# Patient Record
Sex: Male | Born: 1990 | Race: White | Hispanic: No | Marital: Married | Smoking: Never smoker
Health system: Southern US, Community
[De-identification: ages and names within clinical notes are randomized; demographics above are authoritative.]

## PROBLEM LIST (undated history)

## (undated) DIAGNOSIS — J45909 Unspecified asthma, uncomplicated: Secondary | ICD-10-CM

## (undated) DIAGNOSIS — T7840XA Allergy, unspecified, initial encounter: Secondary | ICD-10-CM

## (undated) DIAGNOSIS — J069 Acute upper respiratory infection, unspecified: Secondary | ICD-10-CM

## (undated) HISTORY — DX: Allergy, unspecified, initial encounter: T78.40XA

## (undated) HISTORY — PX: FRACTURE SURGERY: SHX138

## (undated) HISTORY — PX: WISDOM TOOTH EXTRACTION: SHX21

## (undated) HISTORY — DX: Acute upper respiratory infection, unspecified: J06.9

## (undated) HISTORY — DX: Unspecified asthma, uncomplicated: J45.909

---

## 2014-07-25 ENCOUNTER — Ambulatory Visit (INDEPENDENT_AMBULATORY_CARE_PROVIDER_SITE_OTHER): Payer: Managed Care, Other (non HMO) | Admitting: Physician Assistant

## 2014-07-25 VITALS — BP 122/82 | HR 98 | Temp 99.1°F | Resp 18 | Ht 69.0 in | Wt 191.0 lb

## 2014-07-25 DIAGNOSIS — R062 Wheezing: Secondary | ICD-10-CM | POA: Diagnosis not present

## 2014-07-25 DIAGNOSIS — J309 Allergic rhinitis, unspecified: Secondary | ICD-10-CM

## 2014-07-25 DIAGNOSIS — R059 Cough, unspecified: Secondary | ICD-10-CM

## 2014-07-25 DIAGNOSIS — R05 Cough: Secondary | ICD-10-CM

## 2014-07-25 DIAGNOSIS — J3089 Other allergic rhinitis: Secondary | ICD-10-CM | POA: Insufficient documentation

## 2014-07-25 MED ORDER — HYDROCODONE-HOMATROPINE 5-1.5 MG/5ML PO SYRP: 5.0000 mL | ORAL_SOLUTION | Freq: Three times a day (TID) | ORAL | Status: DC | PRN

## 2014-07-25 MED ORDER — ALBUTEROL SULFATE (2.5 MG/3ML) 0.083% IN NEBU
2.5000 mg | INHALATION_SOLUTION | Freq: Once | RESPIRATORY_TRACT | Status: AC
  Administered 2014-07-25: 2.5 mg via RESPIRATORY_TRACT

## 2014-07-25 MED ORDER — IPRATROPIUM BROMIDE 0.02 % IN SOLN
0.5000 mg | Freq: Once | RESPIRATORY_TRACT | Status: AC
  Administered 2014-07-25: 0.5 mg via RESPIRATORY_TRACT

## 2014-07-25 MED ORDER — BENZONATATE 100 MG PO CAPS: 100.0000 mg | ORAL_CAPSULE | Freq: Three times a day (TID) | ORAL | Status: DC | PRN

## 2014-07-25 NOTE — Patient Instructions (Signed)
I think your allergic rhinitis is leading to post nasal drip and cough. For the allergies, zyrtec and flonase daily. Continue using the sudafed daily for congestion.  For the cough, tessalon during the day and hycodan at night will help. The hycodan will help you to sleep. Please let us know if you're not improved in one week.

## 2014-07-25 NOTE — Progress Notes (Signed)
   Subjective:    Patient ID: Douglas Cervantes, male    DOB: May 24, 1990, 24 y.o.   MRN: [id number]  Chief Complaint  Patient presents with  . Cough    X 1 week  . Sore Throat    X 1 week   Patient Active Problem List   Diagnosis Date Noted  . Allergic rhinitis 07/25/2014   Prior to Admission medications   Medication Sig Start Date End Date Taking? Authorizing Provider  acetaminophen (TYLENOL) 500 MG tablet Take 500 mg by mouth every 6 (six) hours as needed.    Historical Provider, MD   Medications, allergies, past medical history, surgical history, family history, social history and problem list reviewed and updated.  HPI  24 yom with pmh mild allergic rhinitis presents with cough, st.  Sx started one wk ago, woke up felt bad. Awoke with sinus pressure, congestion, st, achy. Sx have been persistent for week. Denies fevers, chills. Denies cp, sob, abd pain, n/v, diarrhea.   Had been working in yard prior to allergy sx starting. No ticks. Has been taking sudafed q12 hrs with some relief.   He doesn't take anything for his allergies.   Review of Systems See HPI.     Objective:   Physical Exam  Constitutional: He is oriented to person, place, and time. He appears well-developed and well-nourished.  Non-toxic appearance. He does not have a sickly appearance. He does not appear ill. No distress.  BP 122/82 mmHg  Pulse 98  Temp(Src) 99.1 F (37.3 C) (Oral)  Resp 18  Ht 5\' 9"  (1.753 m)  Wt 191 lb (86.637 kg)  BMI 28.19 kg/m2  SpO2 98%   HENT:  Right Ear: Tympanic membrane normal.  Left Ear: Tympanic membrane normal.  Nose: No mucosal edema or rhinorrhea. Right sinus exhibits no maxillary sinus tenderness and no frontal sinus tenderness. Left sinus exhibits no maxillary sinus tenderness and no frontal sinus tenderness.  Mouth/Throat: Uvula is midline, oropharynx is clear and moist and mucous membranes are normal.  Pulmonary/Chest: Effort normal. He has no decreased breath sounds.  He has wheezes in the right middle field, the right lower field and the left middle field. He has no rhonchi. He has no rales.  Lymphadenopathy:       Head (right side): No submental, no submandibular and no tonsillar adenopathy present.       Head (left side): No submental, no submandibular and no tonsillar adenopathy present.  Neurological: He is alert and oriented to person, place, and time.  Psychiatric: He has a normal mood and affect. His speech is normal.   Duoneb done. Lung sounds post duoneb: Improved. No wheezing, moving air well.      Assessment & Plan:   [age] yom with pmh mild allergic rhinitis presents with cough, st.  Allergic rhinitis, unspecified allergic rhinitis type Cough - Plan: benzonatate (TESSALON) 100 MG capsule, HYDROcodone-homatropine (HYCODAN) 5-1.5 MG/5ML syrup Wheezing - Plan: albuterol (PROVENTIL) (2.5 MG/3ML) 0.083% nebulizer solution 2.5 mg, ipratropium (ATROVENT) nebulizer solution 0.5 mg --no signs infx with one week sickness, normal vitals, benign exam other than wheezing --wheezing cleared with duoneb --likely allergies leading to post nasal drip/cough --zyrtec, flonase, tessalon, hycodan, continue sudafed --pt declines home albuterol  Donnajean Lopesodd M. Koby Pickup, PA-C Physician Assistant-Certified [facility]  07/25/2014 5:35 PM

## 2014-07-31 ENCOUNTER — Ambulatory Visit (INDEPENDENT_AMBULATORY_CARE_PROVIDER_SITE_OTHER): Payer: Managed Care, Other (non HMO) | Admitting: Family Medicine

## 2014-07-31 VITALS — BP 108/88 | HR 96 | Temp 98.8°F | Resp 18 | Ht 69.0 in | Wt 191.6 lb

## 2014-07-31 DIAGNOSIS — R05 Cough: Secondary | ICD-10-CM

## 2014-07-31 DIAGNOSIS — R053 Chronic cough: Secondary | ICD-10-CM

## 2014-07-31 DIAGNOSIS — J209 Acute bronchitis, unspecified: Secondary | ICD-10-CM | POA: Diagnosis not present

## 2014-07-31 MED ORDER — AZITHROMYCIN 250 MG PO TABS: ORAL_TABLET | ORAL | Status: DC

## 2014-07-31 MED ORDER — HYDROCOD POLST-CPM POLST ER 10-8 MG/5ML PO SUER: 5.0000 mL | Freq: Two times a day (BID) | ORAL | Status: DC | PRN

## 2014-07-31 NOTE — Patient Instructions (Signed)
Use the azithromycin antibiotic for likely bronchitis You can use the tussionex syrup as needed for cough- use this OR your other cough syrup, not both at he same time Remember this will make you sleepy and does last about 12 hours Try some of the Cepachol sore throat lozenges for your cough  Let me know if you are not better soon!

## 2014-07-31 NOTE — Progress Notes (Signed)
[facility] [address], [place] [address] [phone]  Date:  07/31/2014   Name:  Douglas Cervantes   DOB:  1990-11-06   MRN:  [id number]  PCP:  No primary care provider on file.    Chief Complaint: Follow-up   History of Present Illness:  Douglas Cervantes is a 24 y.o. very pleasant male patient who presents with the following:  Here today for a recheck of a cough- he was here just about a week ago with one week of sx- sinus pressure, congestion, ST, aches. Treated with hycodan, neb treatment here in office.  He is here today as he still has a cough- the cough medication does help a bit but he is not improving.   The hycodan works for a couple of hours and then wears off He is not sleeping well The ocugh is generally dry- he does not notice sinus sx.   He has not noted a fever- he is geeneraly in good health  Patient Active Problem List   Diagnosis Date Noted  . Allergic rhinitis 07/25/2014    Past Medical History  Diagnosis Date  . Allergy   . Asthma     Past Surgical History  Procedure Laterality Date  . Fracture surgery    . Wisdom tooth extraction      History  Substance Use Topics  . Smoking status: Never Smoker   . Smokeless tobacco: Not on file  . Alcohol Use: 0.0 oz/week    0 Standard drinks or equivalent per week    History reviewed. No pertinent family history.  No Known Allergies  Medication list has been reviewed and updated.  Current Outpatient Prescriptions on File Prior to Visit  Medication Sig Dispense Refill  . acetaminophen (TYLENOL) 500 MG tablet Take 500 mg by mouth every 6 (six) hours as needed.    . benzonatate (TESSALON) 100 MG capsule Take 1-2 capsules (100-200 mg total) by mouth 3 (three) times daily as needed for cough. 40 capsule 0  . HYDROcodone-homatropine (HYCODAN) 5-1.5 MG/5ML syrup Take 5 mLs by mouth every 8 (eight) hours as needed for cough. 120 mL 0   No current facility-administered medications on file prior  to visit.    Review of Systems:  As per HPI- otherwise negative.   Physical Examination: Filed Vitals:   07/31/14 1223  BP: 108/88  Pulse: 108  Temp: 98.8 F (37.1 C)  Resp: 18   Filed Vitals:   07/31/14 1223  Height: 5\' 9"  (1.753 m)  Weight: 191 lb 9.6 oz (86.909 kg)   Body mass index is 28.28 kg/(m^2). Ideal Body Weight: Weight in (lb) to have BMI = 25: 168.9  GEN: WDWN, NAD, Non-toxic, A & O x 3, looks well HEENT: Atraumatic, Normocephalic. Neck supple. No masses, No LAD.  Bilateral TM wnl, oropharynx normal.  PEERL,EOMI.   Ears and Nose: No external deformity. CV: RRR, No M/G/R. No JVD. No thrill. No extra heart sounds. PULM: CTA B, no wheezes, crackles, rhonchi. No retractions. No resp. distress. No accessory muscle use. EXTR: No c/c/e NEURO Normal gait.  PSYCH: Normally interactive. Conversant. Not depressed or anxious appearing.  Calm demeanor.    Assessment and Plan: Acute bronchitis, unspecified organism - Plan: azithromycin (ZITHROMAX) 250 MG tablet  Persistent cough - Plan: chlorpheniramine-HYDROcodone (TUSSIONEX PENNKINETIC ER) 10-8 MG/5ML SUER  Treat for cough for over 2 weeks with azithromycin, tussionex as hycodan is not controlling his cough Follow-up if not better soon- Sooner if worse.  Signed Lamar Blinks, MD

## 2014-08-06 ENCOUNTER — Ambulatory Visit (INDEPENDENT_AMBULATORY_CARE_PROVIDER_SITE_OTHER): Payer: Managed Care, Other (non HMO)

## 2014-08-06 ENCOUNTER — Ambulatory Visit (INDEPENDENT_AMBULATORY_CARE_PROVIDER_SITE_OTHER): Payer: Managed Care, Other (non HMO) | Admitting: Family Medicine

## 2014-08-06 VITALS — BP 130/80 | HR 93 | Temp 98.8°F | Resp 16 | Ht 70.0 in | Wt 194.2 lb

## 2014-08-06 DIAGNOSIS — R053 Chronic cough: Secondary | ICD-10-CM

## 2014-08-06 DIAGNOSIS — R05 Cough: Secondary | ICD-10-CM | POA: Diagnosis not present

## 2014-08-06 MED ORDER — ALBUTEROL SULFATE HFA 108 (90 BASE) MCG/ACT IN AERS: 2.0000 | INHALATION_SPRAY | Freq: Four times a day (QID) | RESPIRATORY_TRACT | Status: DC | PRN

## 2014-08-06 MED ORDER — PREDNISONE 20 MG PO TABS: ORAL_TABLET | ORAL | Status: DC

## 2014-08-06 NOTE — Progress Notes (Signed)
[facility] [address], [place] [address] [phone]  Date:  08/06/2014   Name:  Douglas Cervantes   DOB:  Aug 18, 1990   MRN:  [id number]  PCP:  No primary care provider on file.    Chief Complaint: Cough   History of Present Illness:  Douglas Cervantes is a 24 y.o. very pleasant male patient who presents with the following:  He was here a week ago with 2 weeks of cough and sinus congestion.  Treated with azithromycin and tussionex.   Here today for a recheck We have not yet done a CXR.   He states that he took the zpack and seemed to get better for a couple of days, but then his sx came back He continues to cough- it is dry.  His main concern is that the cough is disturbing his sleep and studies He does not have a fever He has not noted any wheezing States that his mom wanted him to ask if he should see a specialist about this.  Offered to set this up but advised that his sx will probably be gone but the time we get this appt made   Patient Active Problem List   Diagnosis Date Noted  . Allergic rhinitis 07/25/2014    Past Medical History  Diagnosis Date  . Allergy   . Asthma     Past Surgical History  Procedure Laterality Date  . Fracture surgery    . Wisdom tooth extraction      History  Substance Use Topics  . Smoking status: Never Smoker   . Smokeless tobacco: Not on file  . Alcohol Use: 0.0 oz/week    0 Standard drinks or equivalent per week    No family history on file.  No Known Allergies  Medication list has been reviewed and updated.  Current Outpatient Prescriptions on File Prior to Visit  Medication Sig Dispense Refill  . chlorpheniramine-HYDROcodone (TUSSIONEX PENNKINETIC ER) 10-8 MG/5ML SUER Take 5 mLs by mouth every 12 (twelve) hours as needed for cough. 90 mL 0  . HYDROcodone-homatropine (HYCODAN) 5-1.5 MG/5ML syrup Take 5 mLs by mouth every 8 (eight) hours as needed for cough. 120 mL 0  . acetaminophen (TYLENOL) 500 MG tablet  Take 500 mg by mouth every 6 (six) hours as needed.    . benzonatate (TESSALON) 100 MG capsule Take 1-2 capsules (100-200 mg total) by mouth 3 (three) times daily as needed for cough. (Patient not taking: Reported on 08/06/2014) 40 capsule 0   No current facility-administered medications on file prior to visit.    Review of Systems:  As per HPI- otherwise negative.   Physical Examination: Filed Vitals:   08/06/14 1100  BP: 130/80  Pulse: 93  Temp: 98.8 F (37.1 C)  Resp: 16   Filed Vitals:   08/06/14 1100  Height:  (1.778 m)  Weight: 194 lb 3.2 oz (88.089 kg)   Body mass index is 27.86 kg/(m^2). Ideal Body Weight: Weight in (lb) to have BMI = 25: 173.9  GEN: WDWN, NAD, Non-toxic, A & O x 3, looks well, coughing some in room HEENT: Atraumatic, Normocephalic. Neck supple. No masses, No LAD.  Bilateral TM wnl, oropharynx normal.  PEERL,EOMI.   Ears and Nose: No external deformity. CV: RRR, No M/G/R. No JVD. No thrill. No extra heart sounds. PULM: CTA B, no wheezes, crackles, rhonchi. No retractions. No resp. distress. No accessory muscle use. EXTR: No c/c/e NEURO Normal gait.  PSYCH: Normally  interactive. Conversant. Not depressed or anxious appearing.  Calm demeanor.   [facility] reading (PRIMARY) by  Dr. Patsy Lageropland. CXR: normal   Assessment and Plan: Persistent cough - Plan: DG Chest 2 View, predniSONE (DELTASONE) 20 MG tablet, albuterol (PROVENTIL HFA;VENTOLIN HFA) 108 (90 BASE) MCG/ACT inhaler  He has noted persistent cough for a couple of weeks.  Treated with a z-pack but he still notes a cough.   He reports a history of asthma as a child and used an inhaler then.   Reassured that this does not seem to be anything dangerous, and he will call me if sx not resolved soon Meds ordered this encounter  Medications  . predniSONE (DELTASONE) 20 MG tablet    Sig: Take 2 pills a day for 4 days, then 1 pill a day for 4 days    Dispense:  12 tablet    Refill:  0  . albuterol  (PROVENTIL HFA;VENTOLIN HFA) 108 (90 BASE) MCG/ACT inhaler    Sig: Inhale 2 puffs into the lungs every 6 (six) hours as needed for wheezing or shortness of breath.    Dispense:  1 Inhaler    Refill:  2     Signed Abbe AmsterdamJessica Copland, MD

## 2014-08-06 NOTE — Patient Instructions (Signed)
I am sorry that your cough is being so troublesome.  However I do not think it is anything dangerous and will likely improve over the next week or so Use the prednisone as directed and the albuterol as needed Let us know if you do not feel better soon!

## 2014-09-11 ENCOUNTER — Ambulatory Visit (INDEPENDENT_AMBULATORY_CARE_PROVIDER_SITE_OTHER): Payer: Managed Care, Other (non HMO) | Admitting: Physician Assistant

## 2014-09-11 VITALS — BP 100/68 | HR 86 | Temp 98.8°F | Resp 16 | Ht 70.5 in | Wt 195.2 lb

## 2014-09-11 DIAGNOSIS — R05 Cough: Secondary | ICD-10-CM | POA: Diagnosis not present

## 2014-09-11 DIAGNOSIS — R058 Other specified cough: Secondary | ICD-10-CM

## 2014-09-11 DIAGNOSIS — Z8709 Personal history of other diseases of the respiratory system: Secondary | ICD-10-CM | POA: Diagnosis not present

## 2014-09-11 MED ORDER — PREDNISONE 20 MG PO TABS: ORAL_TABLET | ORAL | Status: DC

## 2014-09-11 MED ORDER — BREATHERITE COLL SPACER ADULT MISC: 1.0000 | Freq: Every day | Status: DC

## 2014-09-11 NOTE — Progress Notes (Signed)
Subjective:    Patient ID: Douglas Cervantes, male    DOB: December 30, 1990, 24 y.o.   MRN: [id number]  HPI Patient present for cough that has been present for over 1 month. Has been seen 3 times in our office and once at a different urgent care. States that he was treated with albuterol inhaler and tessalon initially which he does not think have worked. Had tussionex which did work, but makes him too tired to function. He has tried delsym which was recommended at  he does not believe has worked. With second visit was given azithromycin and hycodan and did have any change in cough, however, sinus pressure had improved. Third visit chest xray was clear and prednisone was given which helped. Currently cough is dry and is constant throughout day. Cough causes mid-back pain. Denies fever, sinus pressure, HA, rhinorrhea, congestion, SOB, CP, N/V. H/o childhood asthma, but not current. H/o allergies that are not affecting him today. Had never smoked. Denies CHF, kidney dz, or recent pulm d/o. Request pulm referral. NKDA.   Review of Systems As noted above.    Objective:   Physical Exam  Constitutional: He is oriented to person, place, and time. He appears well-developed and well-nourished. No distress.  Blood pressure 100/68, pulse 86, temperature 98.8 F (37.1 C), temperature source Oral, resp. rate 16, height 5' 10.5" (1.791 m), weight 195 lb 3.2 oz (88.542 kg), SpO2 98 %.  HENT:  Head: Normocephalic and atraumatic.  Right Ear: Tympanic membrane, external ear and ear canal normal.  Left Ear: Tympanic membrane, external ear and ear canal normal.  Nose: Nose normal. No mucosal edema or rhinorrhea. Right sinus exhibits no maxillary sinus tenderness and no frontal sinus tenderness. Left sinus exhibits no maxillary sinus tenderness and no frontal sinus tenderness.  Mouth/Throat: Uvula is midline, oropharynx is clear and moist and mucous membranes are normal. No oropharyngeal exudate, posterior oropharyngeal edema  or posterior oropharyngeal erythema.  Eyes: Conjunctivae are normal. Pupils are equal, round, and reactive to light. Right eye exhibits no discharge. Left eye exhibits no discharge. No scleral icterus.  Neck: Normal range of motion. Neck supple. No JVD present. No thyromegaly present.  Cardiovascular: Normal rate, regular rhythm and normal heart sounds.  PMI is not displaced.  Exam reveals no gallop and no friction rub.   No murmur heard. Pulmonary/Chest: Effort normal and breath sounds normal. No respiratory distress. He has no decreased breath sounds. He has no wheezes. He has no rhonchi. He has no rales. He exhibits no tenderness.  Abdominal: Soft. Bowel sounds are normal. He exhibits no distension and no mass. There is no tenderness. There is no rebound and no guarding.  Musculoskeletal: He exhibits no edema.  Lymphadenopathy:    He has no cervical adenopathy.  Neurological: He is alert and oriented to person, place, and time.  Skin: Skin is warm and dry. No rash noted. He is not diaphoretic. No erythema. No pallor.      Assessment & Plan:  1. Post-viral cough syndrome 2. Personal history of asthma Can continue to use cepacol. Can use dextromethoraphan otc. If sx improve before pulm appt, can cancel. Explained cough syndrome and that condition must run its course.  - predniSONE (DELTASONE) 20 MG tablet; Take 2 pills a day for 4 days, then 1 pill a day for 4 days  Dispense: 12 tablet; Refill: 0 - Ambulatory referral to Pulmonology - Spacer/Aero-Holding Chambers (BREATHERITE COLL SPACER ADULT) MISC; Take 1 Device by mouth daily.  Dispense: 1  each; Refill: 0     Landon Bassford PA-C  [facility] 09/11/2014 9:50 AM

## 2014-09-11 NOTE — Patient Instructions (Signed)
Cough, Adult  A cough is a reflex that helps clear your throat and airways. It can help heal the body or may be a reaction to an irritated airway. A cough may only last 2 or 3 weeks (acute) or may last more than 8 weeks (chronic).  CAUSES Acute cough:  Viral or bacterial infections. Chronic cough:  Infections.  Allergies.  Asthma.  Post-nasal drip.  Smoking.  Heartburn or acid reflux.  Some medicines.  Chronic lung problems (COPD).  Cancer. SYMPTOMS   Cough.  Fever.  Chest pain.  Increased breathing rate.  High-pitched whistling sound when breathing (wheezing).  Colored mucus that you cough up (sputum). TREATMENT   A bacterial cough may be treated with antibiotic medicine.  A viral cough must run its course and will not respond to antibiotics.  Your caregiver may recommend other treatments if you have a chronic cough. HOME CARE INSTRUCTIONS   Only take over-the-counter or prescription medicines for pain, discomfort, or fever as directed by your caregiver. Use cough suppressants only as directed by your caregiver.  Use a cold steam vaporizer or humidifier in your bedroom or home to help loosen secretions.  Sleep in a semi-upright position if your cough is worse at night.  Rest as needed.  Stop smoking if you smoke. SEEK IMMEDIATE MEDICAL CARE IF:   You have pus in your sputum.  Your cough starts to worsen.  You cannot control your cough with suppressants and are losing sleep.  You begin coughing up blood.  You have difficulty breathing.  You develop pain which is getting worse or is uncontrolled with medicine.  You have a fever. MAKE SURE YOU:   Understand these instructions.  Will watch your condition.  Will get help right away if you are not doing well or get worse. Document Released: 07/09/2010 Document Revised: 04/04/2011 Document Reviewed: 07/09/2010 ExitCare Patient Information 2015 [employer]. This information is not intended  to replace advice given to you by your health care provider. Make sure you discuss any questions you have with your health care provider.  

## 2016-02-08 ENCOUNTER — Ambulatory Visit (INDEPENDENT_AMBULATORY_CARE_PROVIDER_SITE_OTHER): Payer: Self-pay | Admitting: Family Medicine

## 2016-02-08 DIAGNOSIS — R509 Fever, unspecified: Secondary | ICD-10-CM | POA: Insufficient documentation

## 2016-02-08 MED ORDER — AMOXICILLIN-POT CLAVULANATE 875-125 MG PO TABS: 1.0000 | ORAL_TABLET | Freq: Two times a day (BID) | ORAL | 0 refills | Status: DC

## 2016-02-08 NOTE — Patient Instructions (Addendum)
  Thank you for coming in,   I have printed a prescription for an antibiotic that you can use if you fever persists until the end of this week.   Please try using tylenol or ibuprofen to help with your fever.   Please follow up if you symptoms worsen.    Please feel free to call with any questions or concerns at any time, at [phone]. --Dr. Jordan LikesSchmitz    IF you received an x-ray today, you will receive an invoice from [facility]. Please contact [facility] at [phone] with questions or concerns regarding your invoice.   IF you received labwork today, you will receive an invoice from [facility]. Please contact [facility] at [phone] with questions or concerns regarding your invoice.   Our billing staff will not be able to assist you with questions regarding bills from these companies.  You will be contacted with the lab results as soon as they are available. The fastest way to get your results is to activate your My Chart account. Instructions are located on the last page of this paperwork. If you have not heard from us regarding the results in 2 weeks, please contact this office.

## 2016-02-08 NOTE — Progress Notes (Signed)
     Subjective:    Patient ID: Douglas Cervantes, male    DOB: 09-08-1990, 26 y.o.   MRN: [id number]  Chief Complaint  Patient presents with  . Generalized Body Aches    x 1 week   . Nasal Congestion    X 2 weeks  . Cough    PCP: No primary care provider on file.  HPI  This is a 26 y.o. male who is presenting with flu like symptoms. Having some post nasasl drip. Having some lightheadedness. Fever on Friday 101.6. He has been running a fever over the weekend. Has been taking tylenol. He had a fever 100.1 this morning but has taken some tylenol. Everyone at work has been sick with similar symptoms. Didn't receive the flu vaccine. He feels like he is exerting himself more and becoming tired easier. Some prodcution with a cough. Having body aches. Doesn't feel like a sinus infection. No travel and no new medications or recent vaccinations. About two weeks ago he started having sinus issues. Has been taking sudphed for the past two weeks. Really started feeling sick on Friday.   Review of Systems  ROS: No unexpected weight loss, swelling, instability, muscle pain, numbness/tingling, redness, otherwise see HPI    Patient Active Problem List   Diagnosis Date Noted  . Fever 02/08/2016  . Allergic rhinitis 07/25/2014   Surgical: right forearm  Social: no tobacco use, occasional alcohol use  Fhx: none     No Known Allergies    Objective:   Physical Exam BP 122/80 (BP Location: Left Arm, Patient Position: Sitting, Cuff Size: Normal)   Pulse (!) 118   Temp 98.4 F (36.9 C) (Oral)   Resp 16   Ht 5\' 11"  (1.803 m)   Wt 210 lb (95.3 kg)   SpO2 97%   BMI 29.29 kg/m  Gen: NAD, alert, cooperative with exam, well-appearing HEENT: NCAT, EOMI, clear conjunctiva, tonsillar exudates, right TM retracted, left TM normal, no cervical LAD CV: tachycardia, regular rhythm, good S1/S2, no murmur, no edema, capillary refill brisk  Resp: CTABL, no wheezes, non-labored Skin: no rashes, normal turgor    Neuro: no gross deficits.  Psych: alert and oriented      Assessment & Plan:   Fever Most likely for the flu. Possible for a underlying sinus infection as long as his symptoms have been present.  - Advised supportive care  - Provided with Augmentin that he can take towards the end of this week if his symptoms fail to improve and continues to run a fever.  - given indications for return.

## 2016-02-08 NOTE — Assessment & Plan Note (Addendum)
Most likely for the flu. Possible for a underlying sinus infection as long as his symptoms have been present.  - Advised supportive care  - Provided with Augmentin that he can take towards the end of this week if his symptoms fail to improve and continues to run a fever.  - given indications for return.

## 2016-06-20 ENCOUNTER — Emergency Department (HOSPITAL_COMMUNITY): Payer: Self-pay

## 2016-06-20 ENCOUNTER — Emergency Department (HOSPITAL_COMMUNITY)
Admission: EM | Admit: 2016-06-20 | Discharge: 2016-06-21 | Disposition: A | Discharge status: Home / Self Care | Payer: Self-pay | Attending: Emergency Medicine | Admitting: Emergency Medicine

## 2016-06-20 ENCOUNTER — Encounter (HOSPITAL_COMMUNITY): Payer: Self-pay | Admitting: Family Medicine

## 2016-06-20 DIAGNOSIS — S61311A Laceration without foreign body of left index finger with damage to nail, initial encounter: Secondary | ICD-10-CM

## 2016-06-20 DIAGNOSIS — S61211A Laceration without foreign body of left index finger without damage to nail, initial encounter: Secondary | ICD-10-CM | POA: Insufficient documentation

## 2016-06-20 DIAGNOSIS — W260XXA Contact with knife, initial encounter: Secondary | ICD-10-CM | POA: Insufficient documentation

## 2016-06-20 DIAGNOSIS — J45909 Unspecified asthma, uncomplicated: Secondary | ICD-10-CM | POA: Insufficient documentation

## 2016-06-20 DIAGNOSIS — Z23 Encounter for immunization: Secondary | ICD-10-CM | POA: Insufficient documentation

## 2016-06-20 DIAGNOSIS — Y999 Unspecified external cause status: Secondary | ICD-10-CM | POA: Insufficient documentation

## 2016-06-20 DIAGNOSIS — Y929 Unspecified place or not applicable: Secondary | ICD-10-CM | POA: Insufficient documentation

## 2016-06-20 DIAGNOSIS — Z79899 Other long term (current) drug therapy: Secondary | ICD-10-CM | POA: Insufficient documentation

## 2016-06-20 DIAGNOSIS — Y939 Activity, unspecified: Secondary | ICD-10-CM | POA: Insufficient documentation

## 2016-06-20 NOTE — ED Notes (Signed)
Bed: WA05 Expected date:  Expected time:  Means of arrival:  Comments: 

## 2016-06-20 NOTE — ED Triage Notes (Signed)
Patient has a laceration to the left index finger from cutting it with a knife by accident. Bleeding is controlled.

## 2016-06-21 MED ORDER — TETANUS-DIPHTH-ACELL PERTUSSIS 5-2.5-18.5 LF-MCG/0.5 IM SUSP: 0.5000 mL | Freq: Once | INTRAMUSCULAR | Status: DC

## 2016-06-21 NOTE — ED Provider Notes (Signed)
[facility]-EMERGENCY DEPT Provider Note   CSN: [id number] Arrival date & time: 06/20/16  2145  By signing my name below, I, Douglas Cervantes, attest that this documentation has been prepared under the direction and in the presence of non-physician practitioner, Sharen Hecklaudia Gyanna Jarema, PA-C. Electronically Signed: Modena JanskyAlbert Cervantes, Scribe. 06/21/2016. 12:49 AM.  History   Chief Complaint Chief Complaint  Patient presents with  . Extremity Laceration   The history is provided by the patient. No language interpreter was used.   HPI Comments: Douglas Cervantes is a 26 y.o. male who presents to the Emergency Department complaining of left 2nd finger wound that occurred today. He states he accidentally cut his left 2nd finger/nail with a knife. He washed wound with water and alcohol PTA. He is unsure of tetanus status. He is right dominant. Denies any fever, current pain, numbness, or other complaints at this time.  Past Medical History:  Diagnosis Date  . Allergy   . Asthma     Patient Active Problem List   Diagnosis Date Noted  . Fever 02/08/2016  . Allergic rhinitis 07/25/2014    Past Surgical History:  Procedure Laterality Date  . FRACTURE SURGERY    . WISDOM TOOTH EXTRACTION         Home Medications    Prior to Admission medications   Medication Sig Start Date End Date Taking? Authorizing Provider  acetaminophen (TYLENOL) 500 MG tablet Take 500 mg by mouth every 6 (six) hours as needed.    [provider]  amoxicillin-clavulanate (AUGMENTIN) 875-125 MG tablet Take 1 tablet by mouth 2 (two) times daily. For a total of 10 days. 02/08/16   Douglas Cervantes, Douglas E, MD  OVER THE COUNTER MEDICATION daily.    [provider]  pseudoephedrine (SUDAFED) 120 MG 12 hr tablet Take 120 mg by mouth 2 (two) times daily.    [provider]    Family History History reviewed. No pertinent family history.  Social History Social History  Substance Use Topics  . Smoking status: Never  Smoker  . Smokeless tobacco: Never Used  . Alcohol use 0.0 oz/week     Comment: 3-4 times a week.      Allergies   Patient has no known allergies.   Review of Systems Review of Systems  Constitutional: Negative for fever.  Musculoskeletal: Negative for arthralgias.  Skin: Positive for wound.  Neurological: Negative for numbness.     Physical Exam Updated Vital Signs BP (!) 152/116 (BP Location: Right Arm)   Pulse (!) 105   Temp 98.7 F (37.1 C) (Oral)   Resp 20   Ht 6' (1.829 m)   Wt 210 lb (95.3 kg)   SpO2 100%   BMI 28.48 kg/m   Physical Exam  Constitutional: He is oriented to person, place, and time. He appears well-developed and well-nourished. No distress.  HENT:  Head: Normocephalic and atraumatic.  Mouth/Throat: Oropharynx is clear and moist.  Eyes: Pupils are equal, round, and reactive to light.  Neck: Normal range of motion. Neck supple.  Cardiovascular: Normal rate, regular rhythm and normal heart sounds.  Exam reveals no gallop and no friction rub.   No murmur heard. Pulmonary/Chest: Effort normal and breath sounds normal. No respiratory distress. He has no wheezes.  Abdominal: Soft. Bowel sounds are normal. He exhibits no distension. There is no tenderness.  Neurological: He is alert and oriented to person, place, and time. He exhibits normal muscle tone. Coordination normal.  Sensation to median, ulnar and radial nerve intact bilaterally  Good hand grip bilaterally   Skin: Skin is warm and dry. No rash noted. No erythema.  1 cm laceration to the radial aspect of the left index fingertip involving the corner of the nail. No nail avulsion. No involvement of the finger pad. Good capillary refill.   Psychiatric: He has a normal mood and affect. His behavior is normal.  Nursing note and vitals reviewed.    ED Treatments / Results  DIAGNOSTIC STUDIES: Oxygen Saturation is 100% on RA, normal by my interpretation.    COORDINATION OF CARE: 12:53 AM- Pt  advised of plan for treatment and pt agrees.  Labs (all labs ordered are listed, but only abnormal results are displayed) Labs Reviewed - No data to display  EKG  EKG Interpretation None       Radiology Dg Finger Index Left  Result Date: 06/20/2016 CLINICAL DATA:  Left index finger laceration EXAM: LEFT INDEX FINGER 2+V COMPARISON:  None. FINDINGS: There is no evidence of fracture or dislocation. Small laceration at the tip of the left index finger without radiopaque foreign body nor osseous involvement. There is no evidence of arthropathy or other focal bone abnormality. IMPRESSION: Soft tissue laceration along the tip of the left index finger without underlying osseous involvement. No radiopaque foreign body is noted. Electronically Signed   By: Tollie Eth M.D.   On: 06/20/2016 23:56    Procedures Procedures (including critical care time)  Medications Ordered in ED Medications  Tdap (BOOSTRIX) injection 0.5 mL (not administered)     Initial Impression / Assessment and Plan / ED Course  I have reviewed the triage vital signs and the nursing notes.  Pertinent labs & imaging results that were available during my care of the patient were reviewed by me and considered in my medical decision making (see chart for details).    Patient is a 26 y.o. yo male that presents with very small laceration to left index fingertip not requiring suture repair.  Very small corner of nail involved, no avulsion. Tdap booster given. Bottom of the wound visualized with bleeding controled, no foreign bodies seen.  No tendon injury. Full ROM of digit. Digit is NVI. Laceration occurred < 12 hours prior to repair which was well tolerated. X-ray negative. Pt has no co morbidities to effect normal wound healing. Discussed wound care with pt and answered questions.   Final Clinical Impressions(s) / ED Diagnoses   Final diagnoses:  Laceration of left index finger without foreign body with damage to nail,  initial encounter    New Prescriptions New Prescriptions   No medications on file   I personally performed the services described in this documentation, which was scribed in my presence. The recorded information has been reviewed and is accurate.     Liberty Handy, PA-C 06/21/16 0121    Liberty Handy, PA-C 06/21/16 Azzie Roup, April, MD 06/21/16 [phone]

## 2016-06-21 NOTE — Discharge Instructions (Signed)
Your laceration did not require suture repair today.  I suspect your laceration will heal on its own over the course or 7-10 days.   Please avoid getting your laceration wet for the next 48 hours to allow skin to adhere.    After 48 hours the most important part of treatment is wound care. Rinse your laceration with clean water and soap at least twice daily or whenever it is dirty.  You may apply antibiotic ointment and wrap with a bandaid during the day to keep clean.    Monitor for signs of infection: increased swelling, redness, warmth, yellow or bloody discharge.

## 2017-12-30 IMAGING — CR DG FINGER INDEX 2+V*L*
3 series · 3 of 3 positions shown · non-contrast
Comparison: None.

CLINICAL DATA: Left index finger laceration

EXAM:
LEFT INDEX FINGER 2+V

[x finger pa left]
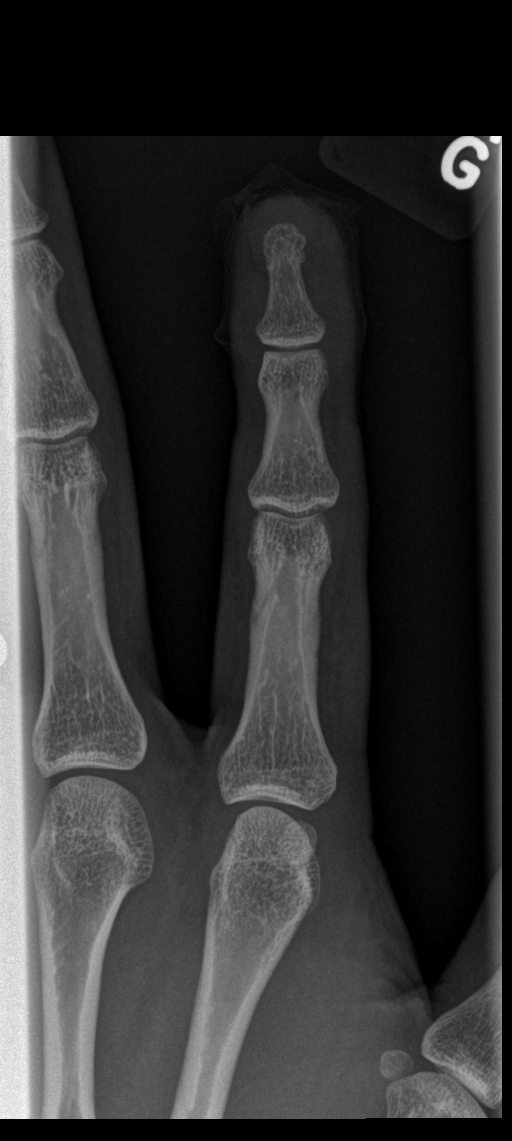

[x finger obl left]
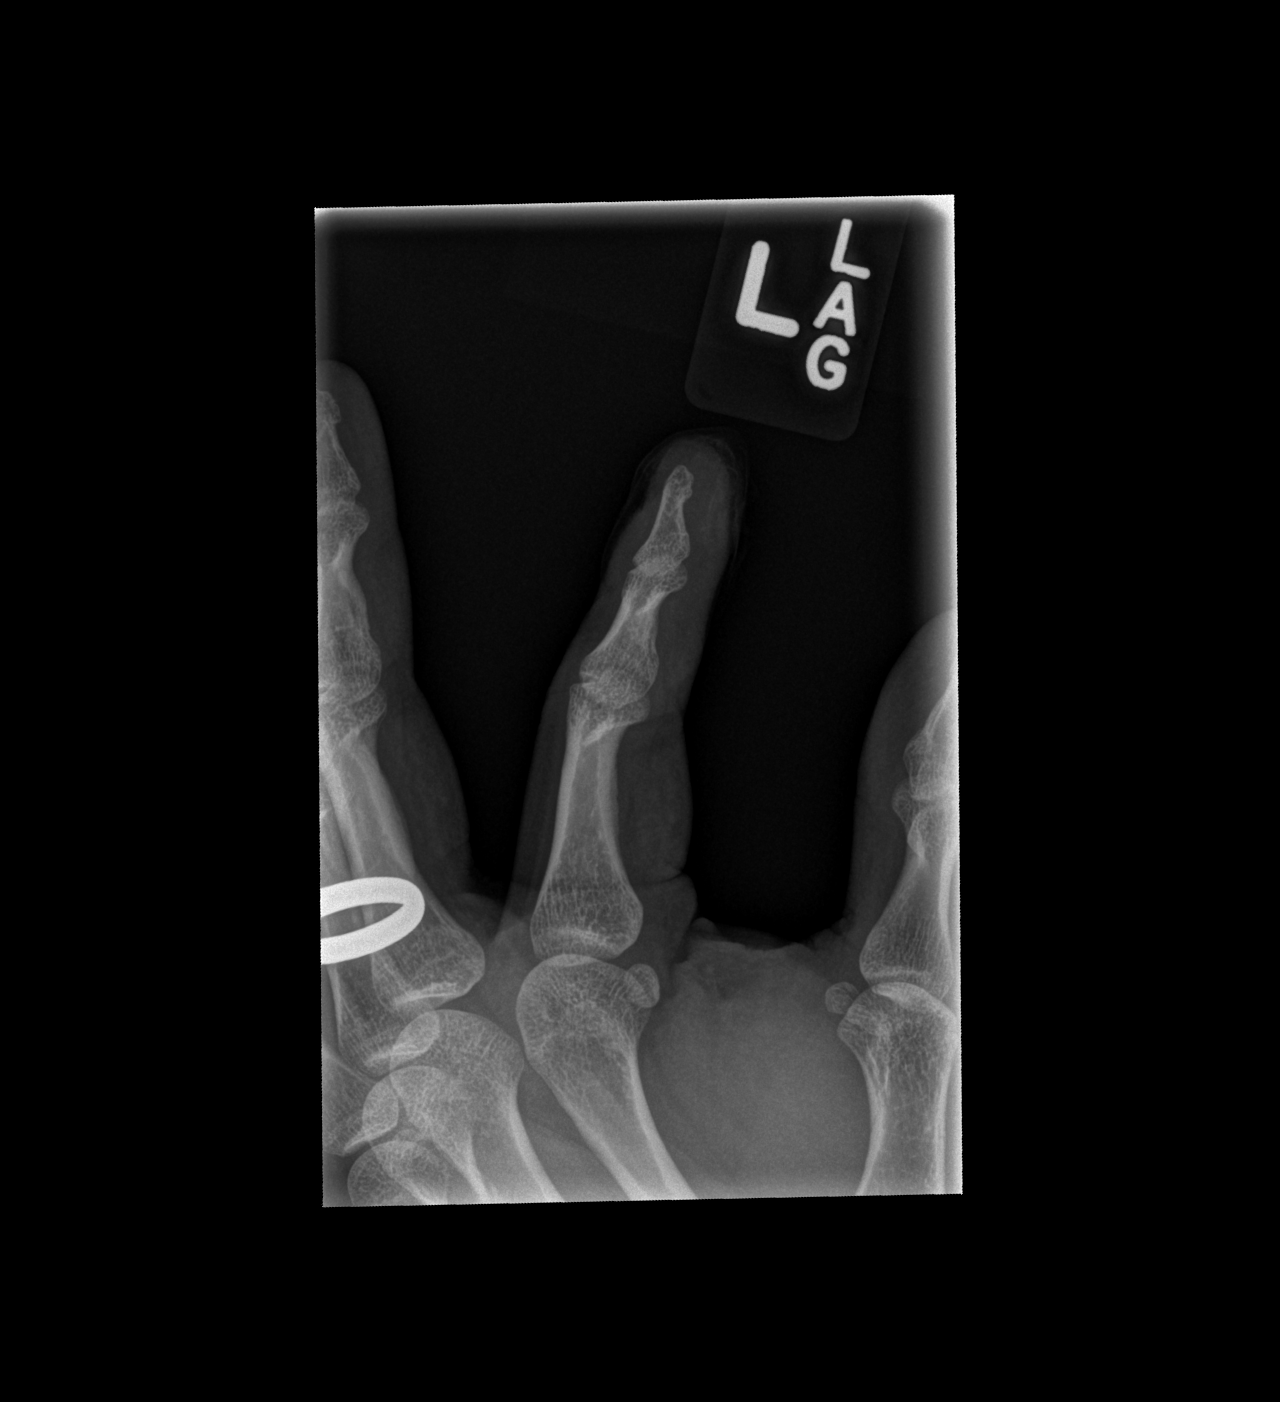

[x finger lat left]
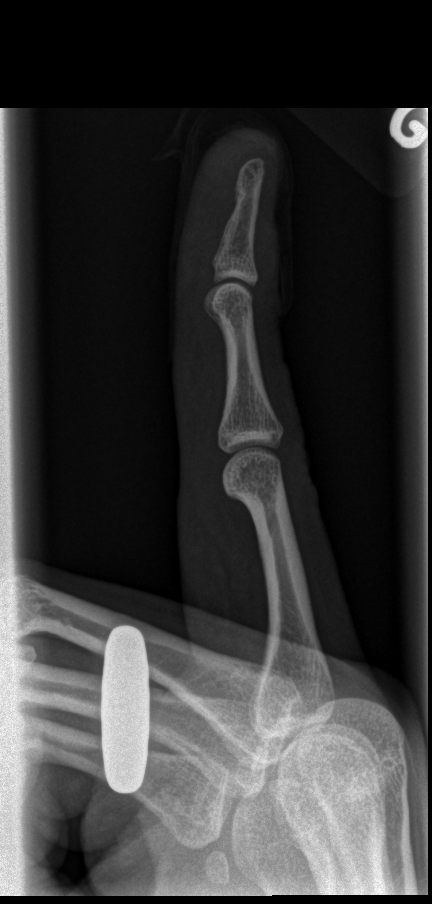

[3 of 3 positions shown; findings below may reference images not displayed]

FINDINGS: There is no evidence of fracture or dislocation. Small laceration at
the tip of the left index finger without radiopaque foreign body nor
osseous involvement. There is no evidence of arthropathy or other
focal bone abnormality.
IMPRESSION: Soft tissue laceration along the tip of the left index finger
without underlying osseous involvement. No radiopaque foreign body
is noted.

## 2018-11-12 ENCOUNTER — Telehealth: Payer: Self-pay

## 2018-11-12 ENCOUNTER — Other Ambulatory Visit: Payer: Self-pay

## 2018-11-12 ENCOUNTER — Encounter: Payer: Self-pay | Admitting: Allergy and Immunology

## 2018-11-12 ENCOUNTER — Ambulatory Visit (INDEPENDENT_AMBULATORY_CARE_PROVIDER_SITE_OTHER): Payer: Managed Care, Other (non HMO) | Admitting: Allergy and Immunology

## 2018-11-12 VITALS — BP 118/70 | HR 102 | Temp 98.0°F | Resp 16 | Ht 70.5 in | Wt 205.0 lb

## 2018-11-12 DIAGNOSIS — J4540 Moderate persistent asthma, uncomplicated: Secondary | ICD-10-CM | POA: Diagnosis not present

## 2018-11-12 DIAGNOSIS — J320 Chronic maxillary sinusitis: Secondary | ICD-10-CM | POA: Diagnosis not present

## 2018-11-12 DIAGNOSIS — J3089 Other allergic rhinitis: Secondary | ICD-10-CM

## 2018-11-12 MED ORDER — BREZTRI AEROSPHERE 160-9-4.8 MCG/ACT IN AERO: 2.0000 | INHALATION_SPRAY | Freq: Two times a day (BID) | RESPIRATORY_TRACT | 5 refills | Status: DC

## 2018-11-12 MED ORDER — CARBINOXAMINE MALEATE 6 MG PO TABS: 1.0000 | ORAL_TABLET | ORAL | 5 refills | Status: DC

## 2018-11-12 MED ORDER — XHANCE 93 MCG/ACT NA EXHU: 2.0000 | INHALANT_SUSPENSION | Freq: Two times a day (BID) | NASAL | 5 refills | Status: DC | PRN

## 2018-11-12 NOTE — Progress Notes (Signed)
New Patient Note  RE: Jarad Barth MRN: [id number] DOB: 1990/09/30 Date of Office Visit: 11/12/2018  Referring provider: Kristen Loader, FNP Primary care provider: Kristen Loader, FNP  Chief Complaint: Sinus Problem and Asthma  History of present illness: Douglas Cervantes is a 28 y.o. male seen today in consultation requested by Jillyn Ledger, [place].  He complains of severe nasal congestion and frequent maxillary sinus pressure.  He reports that the pressure over his cheekbones "feels like cantaloupes."  These symptoms seem to be triggered by rapid weather changes, temperature changes, and season change.  He reports that his symptoms improve when he is indoors and wearing a mask outdoors seems to be helpful.  He is currently taking 120 mg of Sudafed every 8 hours in an attempt to relieve the persistent nasal congestion.  He has never tried a prescription nasal spray and does not like nasal sprays because of the sensation in his nose.  However, he is willing to try a prescription nasal spray if that is necessary. He reports that he has had asthma symptoms since he was 28 years old.  The lower respiratory symptoms improved in his late teens, however seem to have returned with bronchitis in 2016.  He was started on Symbicort 80/4.5 g at that time.  He reports that the cough has been well controlled with Symbicort since that time, however over the past few months he has been experiencing dyspnea, chest tightness, and wheezing despite compliance with Symbicort.  He does not use a spacer device with HFA inhalers.  He was started on montelukast 10 mg daily without perceived benefit.  Assessment and plan: Perennial allergic rhinitis with predominantly nonallergic component  Aeroallergen avoidance measures have been discussed and provided in written form.  A prescription has been provided for [facility], 2 actuations per nostril twice a day. Proper technique has been discussed and demonstrated.  Nasal saline  lavage (NeilMed) has been recommended as needed and prior to medicated nasal sprays along with instructions for proper administration.  A prescription has been provided for RyVent (carbinoxamine maleate) 6mg  every 6-8 hours as needed.  For thick post nasal drainage, nasal congestion, and/or sinus pressure, add guaifenesin 1200 mg (Mucinex Maximum Strength) plus/minus pseudoephedrine 120 mg  twice daily as needed with adequate hydration as discussed. Pseudoephedrine is only to be used for short-term relief of nasal/sinus congestion. Long-term use is discouraged due to potential side effects.  Chronic maxillary sinusitis  Treatment plan as outlined above for mixed rhinitis.  If symptoms persist or progress despite this treatment plan, further evaluation by otolaryngology (Dr. Benjamine Mola) is warranted.  Moderate persistent asthma Currently with suboptimal control.  A sample and prescription have been provided for BrezTri 160 mcg, 2 inhalations twice daily.  To maximize pulmonary deposition, a spacer has been provided along with instructions for its proper administration with an HFA inhaler.  Discontinue Symbicort 80 and montelukast.  Continue albuterol HFA, 1 to 2 inhalations every 4-6 hours if needed.  Subjective and objective measures of pulmonary function will be followed and the treatment plan will be adjusted accordingly.   Meds ordered this encounter  Medications  . Fluticasone Propionate (XHANCE) 93 MCG/ACT EXHU    Sig: Place 2 sprays into both nostrils 2 (two) times daily as needed.    Dispense:  32 mL    Refill:  5  . Carbinoxamine Maleate (RYVENT) 6 MG TABS    Sig: Take 1 tablet by mouth See admin instructions. Every 6-8 hours as needed  Dispense:  120 tablet    Refill:  5  . Budeson-Glycopyrrol-Formoterol (BREZTRI AEROSPHERE) 160-9-4.8 MCG/ACT AERO    Sig: Inhale 2 puffs into the lungs 2 (two) times daily.    Dispense:  10.7 g    Refill:  5    Diagnostics: Spirometry:  Spirometry reveals an FVC of 4.96 L and an FEV1 of 4.23 L (92% predicted) without postbronchodilator improvement.  This study was performed while the patient was asymptomatic.  Please see scanned spirometry results for details. Epicutaneous testing: Negative despite a positive histamine control. Intradermal testing: Positive to molds.   Physical examination: Blood pressure 118/70, pulse (!) 102, temperature 98 F (36.7 C), temperature source Temporal, resp. rate 16, height 5' 10.5" (1.791 m), weight 205 lb (93 kg), SpO2 97 %.  General: Alert, interactive, in no acute distress. HEENT: TMs pearly gray, turbinates moderately edematous with clear discharge, post-pharynx moderately erythematous. Neck: Supple without lymphadenopathy. Lungs: Clear to auscultation without wheezing, rhonchi or rales. CV: Normal S1, S2 without murmurs. Abdomen: Nondistended, nontender. Skin: Warm and dry, without lesions or rashes. Extremities:  No clubbing, cyanosis or edema. Neuro:   Grossly intact.  Review of systems:  Review of systems negative except as noted in HPI / PMHx or noted below: Review of Systems  Constitutional: Negative.   HENT: Negative.   Eyes: Negative.   Respiratory: Negative.   Cardiovascular: Negative.   Gastrointestinal: Negative.   Genitourinary: Negative.   Musculoskeletal: Negative.   Skin: Negative.   Neurological: Negative.   Endo/Heme/Allergies: Negative.   Psychiatric/Behavioral: Negative.     Past medical history:  Past Medical History:  Diagnosis Date  . Allergy   . Asthma   . Recurrent upper respiratory infection (URI)     Past surgical history:  Past Surgical History:  Procedure Laterality Date  . FRACTURE SURGERY    . WISDOM TOOTH EXTRACTION      Family history: Family History  Problem Relation Age of Onset  . Allergic rhinitis Father   . Asthma Paternal Grandmother   . Angioedema Neg Hx   . Atopy Neg Hx   . Immunodeficiency Neg Hx   . Urticaria Neg  Hx     Social history: Social History   Socioeconomic History  . Marital status: Single    Spouse name: Not on file  . Number of children: Not on file  . Years of education: Not on file  . Highest education level: Not on file  Occupational History  . Not on file  Social Needs  . Financial resource strain: Not on file  . Food insecurity    Worry: Not on file    Inability: Not on file  . Transportation needs    Medical: Not on file    Non-medical: Not on file  Tobacco Use  . Smoking status: Never Smoker  . Smokeless tobacco: Never Used  Substance and Sexual Activity  . Alcohol use: Yes    Alcohol/week: 0.0 standard drinks    Comment: 3-4 times a week.   . Drug use: No  . Sexual activity: Not on file  Lifestyle  . Physical activity    Days per week: Not on file    Minutes per session: Not on file  . Stress: Not on file  Relationships  . Social Musician on phone: Not on file    Gets together: Not on file    Attends religious service: Not on file    Active member of club or organization:  Not on file    Attends meetings of clubs or organizations: Not on file    Relationship status: Not on file  . Intimate partner violence    Fear of current or ex partner: Not on file    Emotionally abused: Not on file    Physically abused: Not on file    Forced sexual activity: Not on file  Other Topics Concern  . Not on file  Social History Narrative  . Not on file    Environmental History: The patient lives in a [address] year old townhouse with carpeting the bedroom and central air/heat.  There are 2 cats in the home which have access to the bedroom.  There is no known mold/water damage in the home.  He has a non-smoker.   Current Outpatient Medications  Medication Sig Dispense Refill  . acetaminophen (TYLENOL) 500 MG tablet Take 500 mg by mouth every 6 (six) hours as needed.    Marland Kitchen. albuterol (VENTOLIN HFA) 108 (90 Base) MCG/ACT inhaler Inhale into the lungs.    .  budesonide-formoterol (SYMBICORT) 80-4.5 MCG/ACT inhaler Inhale into the lungs.    Marland Kitchen. guaiFENesin (MUCINEX) 600 MG 12 hr tablet Take 1,200 mg by mouth 2 (two) times daily.    . montelukast (SINGULAIR) 10 MG tablet TK 1 T PO QD    . PARoxetine (PAXIL) 10 MG tablet     . pseudoephedrine (SUDAFED) 120 MG 12 hr tablet Take 120 mg by mouth 2 (two) times daily.    . Budeson-Glycopyrrol-Formoterol (BREZTRI AEROSPHERE) 160-9-4.8 MCG/ACT AERO Inhale 2 puffs into the lungs 2 (two) times daily. 10.7 g 5  . Carbinoxamine Maleate (RYVENT) 6 MG TABS Take 1 tablet by mouth See admin instructions. Every 6-8 hours as needed 120 tablet 5  . Fluticasone Propionate (XHANCE) 93 MCG/ACT EXHU Place 2 sprays into both nostrils 2 (two) times daily as needed. 32 mL 5   No current facility-administered medications for this visit.     Known medication allergies: No Known Allergies  I appreciate the opportunity to take part in Lipa's care. Please do not hesitate to contact me with questions.  Sincerely,   R. Jorene Guestarter Ronak Duquette, MD

## 2018-11-12 NOTE — Assessment & Plan Note (Signed)
Currently with suboptimal control.  A sample and prescription have been provided for BrezTri 160 mcg, 2 inhalations twice daily.  To maximize pulmonary deposition, a spacer has been provided along with instructions for its proper administration with an HFA inhaler.  Discontinue Symbicort 80 and montelukast.  Continue albuterol HFA, 1 to 2 inhalations every 4-6 hours if needed.  Subjective and objective measures of pulmonary function will be followed and the treatment plan will be adjusted accordingly.

## 2018-11-12 NOTE — Assessment & Plan Note (Signed)
   Aeroallergen avoidance measures have been discussed and provided in written form.  A prescription has been provided for [facility], 2 actuations per nostril twice a day. Proper technique has been discussed and demonstrated.  Nasal saline lavage (NeilMed) has been recommended as needed and prior to medicated nasal sprays along with instructions for proper administration.  A prescription has been provided for RyVent (carbinoxamine maleate) 6mg  every 6-8 hours as needed.  For thick post nasal drainage, nasal congestion, and/or sinus pressure, add guaifenesin 1200 mg (Mucinex Maximum Strength) plus/minus pseudoephedrine 120 mg  twice daily as needed with adequate hydration as discussed. Pseudoephedrine is only to be used for short-term relief of nasal/sinus congestion. Long-term use is discouraged due to potential side effects.

## 2018-11-12 NOTE — Patient Instructions (Signed)
Perennial allergic rhinitis with predominantly nonallergic component  Aeroallergen avoidance measures have been discussed and provided in written form.  A prescription has been provided for [facility], 2 actuations per nostril twice a day. Proper technique has been discussed and demonstrated.  Nasal saline lavage (NeilMed) has been recommended as needed and prior to medicated nasal sprays along with instructions for proper administration.  A prescription has been provided for RyVent (carbinoxamine maleate) 6mg  every 6-8 hours as needed.  For thick post nasal drainage, nasal congestion, and/or sinus pressure, add guaifenesin 1200 mg (Mucinex Maximum Strength) plus/minus pseudoephedrine 120 mg  twice daily as needed with adequate hydration as discussed. Pseudoephedrine is only to be used for short-term relief of nasal/sinus congestion. Long-term use is discouraged due to potential side effects.  Chronic maxillary sinusitis  Treatment plan as outlined above for mixed rhinitis.  If symptoms persist or progress despite this treatment plan, further evaluation by otolaryngology (Dr. Benjamine Mola) is warranted.  Moderate persistent asthma Currently with suboptimal control.  A sample and prescription have been provided for BrezTri 160 mcg, 2 inhalations twice daily.  To maximize pulmonary deposition, a spacer has been provided along with instructions for its proper administration with an HFA inhaler.  Discontinue Symbicort 80 and montelukast.  Continue albuterol HFA, 1 to 2 inhalations every 4-6 hours if needed.  Subjective and objective measures of pulmonary function will be followed and the treatment plan will be adjusted accordingly.   Return in about 3 months (around 02/12/2019), or if symptoms worsen or fail to improve.  Control of Mold Allergen  Mold and fungi can grow on a variety of surfaces provided certain temperature and moisture conditions exist.  Outdoor molds grow on plants, decaying  vegetation and soil.  The major outdoor mold, Alternaria and Cladosporium, are found in very high numbers during hot and dry conditions.  Generally, a late Summer - Fall peak is seen for common outdoor fungal spores.  Rain will temporarily lower outdoor mold spore count, but counts rise rapidly when the rainy period ends.  The most important indoor molds are Aspergillus and Penicillium.  Dark, humid and poorly ventilated basements are ideal sites for mold growth.  The next most common sites of mold growth are the bathroom and the kitchen.  Outdoor [employer] 1. Use air conditioning and keep windows closed 2. Avoid exposure to decaying vegetation. 3. Avoid leaf raking. 4. Avoid grain handling. 5. Consider wearing a face mask if working in moldy areas.  Indoor Mold Control 1. Maintain humidity below 50%. 2. Clean washable surfaces with 5% bleach solution. 3. Remove sources e.g. Contaminated carpets.

## 2018-11-12 NOTE — Assessment & Plan Note (Signed)
   Treatment plan as outlined above for mixed rhinitis.  If symptoms persist or progress despite this treatment plan, further evaluation by otolaryngology (Dr. Benjamine Mola) is warranted.

## 2018-11-12 NOTE — Telephone Encounter (Signed)
It will be in the mail tomorrow. Thank you.

## 2018-11-12 NOTE — Telephone Encounter (Signed)
Patient was seen today and needs a break down for his copay payment today.   Thanks

## 2018-11-13 ENCOUNTER — Telehealth: Payer: Self-pay | Admitting: Allergy and Immunology

## 2018-11-13 MED ORDER — BUDESONIDE-FORMOTEROL FUMARATE 160-4.5 MCG/ACT IN AERO: 2.0000 | INHALATION_SPRAY | Freq: Two times a day (BID) | RESPIRATORY_TRACT | 5 refills | Status: DC

## 2018-11-13 MED ORDER — SPIRIVA RESPIMAT 1.25 MCG/ACT IN AERS: 2.0000 | INHALATION_SPRAY | Freq: Every day | RESPIRATORY_TRACT | 5 refills | Status: DC

## 2018-11-13 NOTE — Telephone Encounter (Signed)
Pt called and said that his ins will not cover Breztri. [employer] [phone]

## 2018-11-13 NOTE — Telephone Encounter (Signed)
Prescriptions have been sent in as instructed and patient informed of med change.

## 2018-11-13 NOTE — Telephone Encounter (Signed)
We will need to switch to Symbicort 160-4.5 g, 2 inhalations via spacer device twice daily, and Spiriva Respimat 1.25 g, 2 inhalations daily.  Thanks.

## 2018-11-13 NOTE — Telephone Encounter (Signed)
Dr. Verlin Fester please advise an alternative.

## 2018-11-15 ENCOUNTER — Telehealth: Payer: Self-pay

## 2018-11-15 ENCOUNTER — Telehealth: Payer: Self-pay | Admitting: Allergy and Immunology

## 2018-11-15 NOTE — Telephone Encounter (Signed)
Try Incruse Ellipta, 1 puff daily (along with the Symbicort).  Thanks.

## 2018-11-15 NOTE — Telephone Encounter (Signed)
Spoke to patient about medication and sent Dr. Verlin Fester a message.

## 2018-11-15 NOTE — Telephone Encounter (Signed)
Patient called stated Spiriva was not approved through insurance. Please advise on alternative. Patient currently has the Symbicort.

## 2018-11-15 NOTE — Telephone Encounter (Signed)
Pt called again and said that his ins did not cover breztri and spiriva so need something else sent. Douglas Cervantes

## 2018-11-15 NOTE — Telephone Encounter (Signed)
PA for Ryvent sent through via [email or web address] awaiting for approval.

## 2018-11-16 MED ORDER — INCRUSE ELLIPTA 62.5 MCG/INH IN AEPB: 1.0000 | INHALATION_SPRAY | Freq: Every day | RESPIRATORY_TRACT | 5 refills | Status: DC

## 2018-11-16 NOTE — Telephone Encounter (Signed)
PA submitted for Breztri:  Waiting approval  Your information has been submitted and will be reviewed by [place]. You may close this dialog, return to your dashboard, and perform other tasks. An electronic determination will be received in CoverMyMeds within 72-120 hours. You can see the latest determination by locating this request on your dashboard or by reopening this request. You will receive a fax copy of the determination. If [name] has not responded in 120 hours, contact [employer] at [phone].

## 2018-11-16 NOTE — Addendum Note (Signed)
Addended by: Valere Dross on: 11/16/2018 07:53 AM   Modules accepted: Orders

## 2018-11-16 NOTE — Telephone Encounter (Signed)
Incruse Ellipta sent to [facility] pharmacy in place of Spiriva

## 2018-11-16 NOTE — Telephone Encounter (Signed)
PA was denied for Ryvent. Carbinoxamine 4mg  is preferred. Please advise.

## 2018-11-16 NOTE — Telephone Encounter (Signed)
PA submitted for Spiriva respimat, waiting approval:  Your information has been submitted and will be reviewed by [place]. You may close this dialog, return to your dashboard, and perform other tasks. An electronic determination will be received in CoverMyMeds within 72-120 hours. You can see the latest determination by locating this request on your dashboard or by reopening this request. You will receive a fax copy of the determination. If [name] has not responded in 120 hours, contact [employer] at [phone].

## 2018-11-16 NOTE — Telephone Encounter (Signed)
Called patient and left a detailed voicemail per [facility] permission. Asked patient to call back if he needs any further assistance.

## 2018-11-19 ENCOUNTER — Other Ambulatory Visit: Payer: Self-pay | Admitting: *Deleted

## 2018-11-19 MED ORDER — CARBINOXAMINE MALEATE 4 MG PO TABS: 4.0000 mg | ORAL_TABLET | Freq: Four times a day (QID) | ORAL | 5 refills | Status: DC | PRN

## 2018-11-19 NOTE — Telephone Encounter (Signed)
Prescription has been sent in. Called patient and informed of medication change. Patient verbalized understanding.

## 2018-11-19 NOTE — Telephone Encounter (Addendum)
This request has been denied.  Douglas Cervantes  It looks like symbicort and incruse has been prescribed. Pt has been notified of the medications and some are ready to be picked up at the pharmacy.

## 2018-11-19 NOTE — Telephone Encounter (Signed)
Outcome :  Spiriva Respimat Approved today  PA Case: [id number], Status: Approved, Coverage Starts on: 11/19/2018 12:00:00 AM, Coverage Ends on: 11/19/2019 12:00:00 AM.

## 2018-11-19 NOTE — Telephone Encounter (Signed)
Carbinoxamine 4 mg q 6-8 hours as needed.  Thanks.

## 2019-01-02 ENCOUNTER — Telehealth: Payer: Self-pay | Admitting: Allergy and Immunology

## 2019-01-02 ENCOUNTER — Telehealth: Payer: Self-pay | Admitting: *Deleted

## 2019-01-02 NOTE — Telephone Encounter (Signed)
Patient will be having new insurance starting 01/25/2019 and sent a list of medications that are preferred with his new insurance. The Symbicort and Spiriva 1.25 are covered for next year so that is not an issues, he is going to check to see if the Carbinoxamine is covered since it was not on the list. He did state that the Truett Perna is not covered on his new insurance and it is $100 a month with his current insurance. He was wondering if we could send in a different nasal spray. Please advise.

## 2019-01-02 NOTE — Telephone Encounter (Signed)
Patient called to check on the list of medications that is no longer covered by his insurance. Patient informed that it was received and still needs to be reviewed.   Patient also states that since the weather getting colder, he has had a wheezing cough for the past 3-4 days that he cannot get rid of it. He states that his rescue inhaler is not working. Patient would like recommendations on what he can do. Patient is willing to come in for an office visit if needed.  Please advise.

## 2019-01-02 NOTE — Telephone Encounter (Signed)
Dr Bobbitt, please advise:   

## 2019-01-02 NOTE — Telephone Encounter (Signed)
Which nasal sprays are on his new insurance list?

## 2019-01-03 ENCOUNTER — Other Ambulatory Visit: Payer: Self-pay

## 2019-01-03 MED ORDER — FLUTICASONE PROPIONATE 50 MCG/ACT NA SUSP: 2.0000 | Freq: Every day | NASAL | 5 refills | Status: DC

## 2019-01-03 NOTE — Telephone Encounter (Signed)
Can you take a look at the list for Dr. Verlin Fester. Thanks

## 2019-01-03 NOTE — Telephone Encounter (Signed)
Not many nasal sprays were on the patient's new insurance formulary. Flonase was the only nasal spray that was listed.

## 2019-01-03 NOTE — Telephone Encounter (Signed)
Ok, unfortunately we will have to go with fluticasone (Flonase), 2 sp per nostril daily as needed. Thanks.

## 2019-01-03 NOTE — Telephone Encounter (Signed)
Spoke with patient. Patient made aware of medication change and instructions per Dr. Verlin Fester. Patient verbalized understanding.

## 2019-01-14 ENCOUNTER — Ambulatory Visit (INDEPENDENT_AMBULATORY_CARE_PROVIDER_SITE_OTHER): Payer: 59 | Admitting: Allergy and Immunology

## 2019-01-14 ENCOUNTER — Telehealth: Payer: Self-pay

## 2019-01-14 ENCOUNTER — Encounter: Payer: Self-pay | Admitting: Allergy and Immunology

## 2019-01-14 DIAGNOSIS — J4541 Moderate persistent asthma with (acute) exacerbation: Secondary | ICD-10-CM | POA: Diagnosis not present

## 2019-01-14 DIAGNOSIS — J3089 Other allergic rhinitis: Secondary | ICD-10-CM | POA: Diagnosis not present

## 2019-01-14 DIAGNOSIS — R05 Cough: Secondary | ICD-10-CM

## 2019-01-14 DIAGNOSIS — R053 Chronic cough: Secondary | ICD-10-CM

## 2019-01-14 MED ORDER — HYDROCOD POLST-CPM POLST ER 10-8 MG/5ML PO SUER: 5.0000 mL | Freq: Two times a day (BID) | ORAL | 0 refills | Status: DC | PRN

## 2019-01-14 MED ORDER — SPIRIVA RESPIMAT 1.25 MCG/ACT IN AERS: 2.0000 | INHALATION_SPRAY | Freq: Every day | RESPIRATORY_TRACT | 2 refills | Status: DC

## 2019-01-14 NOTE — Telephone Encounter (Signed)
Pt calling today.  Since the weather has gotten colder, his cough has increased.  Has been using a humidifier to help, but is still wheezy.  Medications were changed to [employer], insurance would not cover so is now using the Incruse and Symbicort.  Using albuterol 1-2 puffs without spacer.  Pt is not getting any better and constantly has to use cough drops to control his cough.  Pt wants to talk to Dr Starling Manns and have a visit.  Visit scheduled for 1115 today, via televisit.

## 2019-01-14 NOTE — Assessment & Plan Note (Signed)
   Prednisone has been provided (as above).  Continue Symbicort 160-4.5 g, 2 inhalations via spacer device twice daily.  A prescription has been provided for Spiriva Respimat 1.25 g, 2 inhalations daily.  Discontinue Incruse Ellipta.  Continue albuterol HFA, 1 to 2 inhalations every 4-6 hours if needed.  The patient has been asked to contact me if his symptoms persist or progress. Otherwise, he may return for follow up in 3 months.

## 2019-01-14 NOTE — Patient Instructions (Addendum)
Cough, persistent The most common causes of chronic cough include the following: upper airway cough syndrome (UACS) which is caused by variety of rhinosinus conditions; asthma; gastroesophageal reflux disease (GERD);  The history suggests that his cough is multifactorial.   A prescription has been provided for prednisone, 40 mg x3 days, 20 mg x1 day, 10 mg x1 day, then stop.  A prescription has been provided for Tussionex ER suspension, 5 mL every 12 hours over the next 3 days, then stop.  Treatment plan as outlined below.   The patient is to inform me if he develops fevers, chills, or discolored mucus production.  Moderate persistent asthma  Prednisone has been provided (as above).  Continue Symbicort 160-4.5 g, 2 inhalations via spacer device twice daily.  A prescription has been provided for Spiriva Respimat 1.25 g, 2 inhalations daily.  Discontinue Incruse Ellipta.  Continue albuterol HFA, 1 to 2 inhalations every 4-6 hours if needed.  The patient has been asked to contact me if his symptoms persist or progress. Otherwise, he may return for follow up in 3 months.  Perennial allergic rhinitis with predominantly nonallergic component  Continue appropriate allergen avoidance measures and fluticasone nasal spray, 2 sprays per nostril daily as needed.  A prescription has been provided for azelastine nasal spray, 1-2 sprays per nostril 2 times daily as needed. Proper nasal spray technique has been discussed and demonstrated.   Nasal saline lavage (NeilMed) has been recommended as needed and prior to medicated nasal sprays along with instructions for proper administration.  For thick post nasal drainage, add guaifenesin 1200 mg (Mucinex Maximum Strength)  twice daily as needed with adequate hydration as discussed.   Return in about 3 months (around 04/14/2019), or if symptoms worsen or fail to improve.

## 2019-01-14 NOTE — Assessment & Plan Note (Signed)
   Continue appropriate allergen avoidance measures and fluticasone nasal spray, 2 sprays per nostril daily as needed.  A prescription has been provided for azelastine nasal spray, 1-2 sprays per nostril 2 times daily as needed. Proper nasal spray technique has been discussed and demonstrated.   Nasal saline lavage (NeilMed) has been recommended as needed and prior to medicated nasal sprays along with instructions for proper administration.  For thick post nasal drainage, add guaifenesin 1200 mg (Mucinex Maximum Strength)  twice daily as needed with adequate hydration as discussed.

## 2019-01-14 NOTE — Progress Notes (Signed)
Follow-up Telemedicine Note  RE: Douglas CoffeeDavid An MRN: [id number] DOB: 1990/03/25 Date of Telemedicine Visit: 01/14/2019  Primary care provider: Soundra PilonBrake, Andrew R, FNP Referring provider: Soundra PilonBrake, Andrew R, FNP  Telemedicine Follow Up Visit via Telephone: I connected with Douglas Coffeeavid Cervantes for a follow up on 01/15/19 by telephone and verified that I am speaking with the correct person using two identifiers.   The limitations, risks, security and privacy concerns of performing an evaluation and management service by telemedicine, the availability of in person appointments, and that there may be a patient responsible charge related to this service were discussed. The patient expressed understanding and agreed to proceed.  Patient is at home. Provider is at the office.  Visit start time: 11:48 am Visit end time: 12:23 pm [facility] and medical assistant/nurse: French Anaracy  History of present illness: Douglas Cervantes is a 28 y.o. male with mixed rhinitis, chronic sinusitis, and moderate persistent asthma, presenting today via telemedicine for a sick visit.  He was last seen in this clinic for his initial evaluation on November 12, 2018.  He reports that over the past 2 weeks he has experienced a persistent cough which has progressed significantly over the past 4 or 5 days.  He believes that the problem may have been precipitated by dry, cold air.  He has found benefit by using a cool mist humidifier.  At times, he feels that the cough originates from a tickle in the base of his throat which then turns into "a wheezing, coughing fit."  Albuterol provides relief on occasion, but at other times does not provide significant relief.  Denies heartburn.  He is not using nasal saline irrigation.  He denies fevers, chills, and discolored mucus production.  He reports that his insurance will be changing at the beginning of the new year.  Incruse will no longer be covered, however Spiriva  Respimat is apparently covered.  He states that when he used TogoXhance he experienced rapid symptom relief stating that his nasal symptoms improved "overnight 100% better" and he no longer needed pseudoephedrine.  However, his insurance does not cover ChicalXhance.  He had used the sample of BrezTri with significant symptom reduction, however his insurance did not cover this medication and therefore he was switched to Symbicort 160/4.5 g, and Incruse Ellipta.  Assessment and plan: Cough, persistent The most common causes of chronic cough include the following: upper airway cough syndrome (UACS) which is caused by variety of rhinosinus conditions; asthma; gastroesophageal reflux disease (GERD);  The history suggests that his cough is multifactorial.   A prescription has been provided for prednisone, 40 mg x3 days, 20 mg x1 day, 10 mg x1 day, then stop.  A prescription has been provided for Tussionex ER suspension, 5 mL every 12 hours over the next 3 days, then stop.  Treatment plan as outlined below.   The patient is to inform me if he develops fevers, chills, or discolored mucus production.  Moderate persistent asthma  Prednisone has been provided (as above).  Continue Symbicort 160-4.5 g, 2 inhalations via spacer device twice daily.  A prescription has been provided for Spiriva Respimat 1.25 g, 2 inhalations daily.  Discontinue Incruse Ellipta.  Continue albuterol HFA, 1 to 2 inhalations every 4-6 hours if needed.  The patient has been asked to contact me if his symptoms persist or progress. Otherwise, he may return for follow up in 3 months.  Perennial allergic rhinitis with predominantly nonallergic component  Continue appropriate  allergen avoidance measures and fluticasone nasal spray, 2 sprays per nostril daily as needed.  A prescription has been provided for azelastine nasal spray, 1-2 sprays per nostril 2 times daily as needed. Proper nasal spray technique has been discussed and  demonstrated.   Nasal saline lavage (NeilMed) has been recommended as needed and prior to medicated nasal sprays along with instructions for proper administration.  For thick post nasal drainage, add guaifenesin 1200 mg (Mucinex Maximum Strength)  twice daily as needed with adequate hydration as discussed.   Meds ordered this encounter  Medications  . chlorpheniramine-HYDROcodone (TUSSIONEX PENNKINETIC ER) 10-8 MG/5ML SUER    Sig: Take 5 mLs by mouth every 12 (twelve) hours as needed for cough.    Dispense:  60 mL    Refill:  0  . Tiotropium Bromide Monohydrate (SPIRIVA RESPIMAT) 1.25 MCG/ACT AERS    Sig: Inhale 2 puffs into the lungs daily.    Dispense:  4 g    Refill:  2  . predniSONE (DELTASONE) tablet 10 mg    Diagnostics: None.  Physical examination: Physical Exam Not obtained as encounter was done via telephone.   The following portions of the patient's history were reviewed and updated as appropriate: allergies, current medications, past family history, past medical history, past social history, past surgical history and problem list.  Allergies as of 01/14/2019   No Known Allergies     Medication List       Accurate as of January 14, 2019 11:59 PM. If you have any questions, ask your nurse or doctor.        STOP taking these medications   Breztri Aerosphere 160-9-4.8 MCG/ACT Aero Generic drug: Budeson-Glycopyrrol-Formoterol Stopped by: Wellington Hampshire, MD     TAKE these medications   acetaminophen 500 MG tablet Commonly known as: TYLENOL Take 500 mg by mouth every 6 (six) hours as needed.   budesonide-formoterol 160-4.5 MCG/ACT inhaler Commonly known as: SYMBICORT Inhale 2 puffs into the lungs 2 (two) times daily.   Carbinoxamine Maleate 4 MG Tabs Take 1 tablet (4 mg total) by mouth every 6 (six) hours as needed.   chlorpheniramine-HYDROcodone 10-8 MG/5ML Suer Commonly known as: Tussionex Pennkinetic ER Take 5 mLs by mouth every 12 (twelve) hours  as needed for cough. Started by: Wellington Hampshire, MD   fluticasone 50 MCG/ACT nasal spray Commonly known as: FLONASE Place 2 sprays into both nostrils daily. As needed. What changed: Another medication with the same name was removed. Continue taking this medication, and follow the directions you see here. Changed by: Wellington Hampshire, MD   guaiFENesin 600 MG 12 hr tablet Commonly known as: MUCINEX Take 1,200 mg by mouth 2 (two) times daily.   Incruse Ellipta 62.5 MCG/INH Aepb Generic drug: umeclidinium bromide Inhale 1 puff into the lungs daily.   PARoxetine 10 MG tablet Commonly known as: PAXIL   pseudoephedrine 120 MG 12 hr tablet Commonly known as: SUDAFED Take 120 mg by mouth 2 (two) times daily.   Spiriva Respimat 1.25 MCG/ACT Aers Generic drug: Tiotropium Bromide Monohydrate Inhale 2 puffs into the lungs daily.       No Known Allergies  Review of systems: Review of systems negative except as noted in HPI / PMHx.  Past Medical History:  Diagnosis Date  . Allergy   . Asthma   . Recurrent upper respiratory infection (URI)     Family History  Problem Relation Age of Onset  . Allergic rhinitis Father   . Asthma Paternal  Grandmother   . Angioedema Neg Hx   . Atopy Neg Hx   . Immunodeficiency Neg Hx   . Urticaria Neg Hx   . Eczema Neg Hx     Social History   Socioeconomic History  . Marital status: Single    Spouse name: Not on file  . Number of children: Not on file  . Years of education: Not on file  . Highest education level: Not on file  Occupational History  . Not on file  Tobacco Use  . Smoking status: Never Smoker  . Smokeless tobacco: Never Used  Substance and Sexual Activity  . Alcohol use: Yes    Alcohol/week: 0.0 standard drinks    Comment: 3-4 times a week.   . Drug use: No  . Sexual activity: Not on file  Other Topics Concern  . Not on file  Social History Narrative  . Not on file   Social Determinants of Health   Financial  Resource Strain:   . Difficulty of Paying Living Expenses: Not on file  Food Insecurity:   . Worried About Charity fundraiser in the Last Year: Not on file  . Ran Out of Food in the Last Year: Not on file  Transportation Needs:   . Lack of Transportation (Medical): Not on file  . Lack of Transportation (Non-Medical): Not on file  Physical Activity:   . Days of Exercise per Week: Not on file  . Minutes of Exercise per Session: Not on file  Stress:   . Feeling of Stress : Not on file  Social Connections:   . Frequency of Communication with Friends and Family: Not on file  . Frequency of Social Gatherings with Friends and Family: Not on file  . Attends Religious Services: Not on file  . Active Member of Clubs or Organizations: Not on file  . Attends Archivist Meetings: Not on file  . Marital Status: Not on file  Intimate Partner Violence:   . Fear of Current or Ex-Partner: Not on file  . Emotionally Abused: Not on file  . Physically Abused: Not on file  . Sexually Abused: Not on file   Previous notes and tests were reviewed.  I discussed the assessment and treatment plan with the patient. The patient was provided an opportunity to ask questions and all were answered. The patient agreed with the plan and demonstrated an understanding of the instructions.   The patient was advised to call back or seek an in-person evaluation if the symptoms worsen or if the condition fails to improve as anticipated.  I provided 30 minutes of non-face-to-face time during this encounter.  I appreciate the opportunity to take part in Marvon's care. Please do not hesitate to contact me with questions.  Sincerely,   R. Edgar Frisk, MD

## 2019-01-14 NOTE — Assessment & Plan Note (Signed)
The most common causes of chronic cough include the following: upper airway cough syndrome (UACS) which is caused by variety of rhinosinus conditions; asthma; gastroesophageal reflux disease (GERD);  The history suggests that his cough is multifactorial.   A prescription has been provided for prednisone, 40 mg x3 days, 20 mg x1 day, 10 mg x1 day, then stop.  A prescription has been provided for Tussionex ER suspension, 5 mL every 12 hours over the next 3 days, then stop.  Treatment plan as outlined below.   The patient is to inform me if he develops fevers, chills, or discolored mucus production.

## 2019-01-14 NOTE — Telephone Encounter (Signed)
Noted and the televisit is complete.

## 2019-01-15 ENCOUNTER — Encounter: Payer: Self-pay | Admitting: Allergy and Immunology

## 2019-01-15 MED ORDER — PREDNISONE 1 MG PO TABS: 10.0000 mg | ORAL_TABLET | Freq: Every day | ORAL | Status: DC

## 2019-01-21 ENCOUNTER — Telehealth: Payer: Self-pay | Admitting: *Deleted

## 2019-01-21 MED ORDER — AZITHROMYCIN 250 MG PO TABS: ORAL_TABLET | ORAL | 0 refills | Status: AC

## 2019-01-21 MED ORDER — PREDNISONE 10 MG PO TABS: ORAL_TABLET | ORAL | 0 refills | Status: AC

## 2019-01-21 NOTE — Telephone Encounter (Addendum)
Patient had televisit with Dr. Verlin Fester 01/14/19 and called the office today stating he is still having a productive cough at times.  Patient was better on Prednisone and once he stopped the cough was just as bad. Tussionex does not help per patient.  Patient did start Prilosec,Spiriva, Mucinex and Azelastine spray.  He has continued his Symbicort 160 mcg 2 inhalations twice a day and Fluticasone.  Stopped Incruse. Patient is using  Albuterol 3-4 times a day.  Patient informed note would be sent to Dr. Verlin Fester for further recommendation.  Patient voiced understanding.  [place]

## 2019-01-21 NOTE — Telephone Encounter (Signed)
Any fever, chills, or discolored mucus production?

## 2019-01-21 NOTE — Telephone Encounter (Signed)
Please send in a prescription for prednisone, 40 mg x3 days, 20 mg x1 day, 10 mg x1 day, then stop. Please call in a prescription for azithromycin, 500 mg on day 1 and 250 mg on days 2 through 5. The prednisone is to decrease inflammation and the azithromycin is in case there is an atypical microbe and also provides additional anti-inflammatory component. If symptoms persist or progress over the next several days, or if the patient becomes febrile or discolored mucus production have him set up a visit. Thanks.

## 2019-01-21 NOTE — Telephone Encounter (Signed)
Called and spoke with the patient and he denies any fever or chills and the mucus he produces is clear. Please advise.

## 2019-01-21 NOTE — Telephone Encounter (Signed)
Medication has been sent to the pharmacy. Called the patient and informed of plan. Patient verbalized understanding and will call back if he needs anything further.

## 2019-01-28 ENCOUNTER — Telehealth: Payer: Self-pay | Admitting: Family Medicine

## 2019-01-28 ENCOUNTER — Telehealth: Payer: Self-pay | Admitting: Allergy and Immunology

## 2019-01-28 ENCOUNTER — Other Ambulatory Visit: Payer: Self-pay

## 2019-01-28 MED ORDER — PREDNISONE 10 MG PO TABS: ORAL_TABLET | ORAL | 0 refills | Status: AC

## 2019-01-28 MED ORDER — PREDNISONE 10 MG PO TABS: ORAL_TABLET | ORAL | 0 refills | Status: DC

## 2019-01-28 NOTE — Telephone Encounter (Signed)
Anne- do you have any recommendations?

## 2019-01-28 NOTE — Telephone Encounter (Signed)
Patient called stating that his mild cough is coming back since he has finished his dosage of Prednisone and antibiotics. Patient states that when he was on Prednisone he was fine.   Patient scheduled an appointment with Thurston Hole on 02/08/2019 at 2:00pm. Patient would like to know if there is anything that could be called into the pharmacy until then.  Uses [facility] on [address].  Please advise.

## 2019-01-28 NOTE — Addendum Note (Signed)
Addended by: Mliss Fritz I on: 01/28/2019 03:02 PM   Modules accepted: Orders

## 2019-01-28 NOTE — Telephone Encounter (Signed)
Patient reports having a dry cough over the last 3 years which occurs mainly when he goes outside or gets into a environment with low humidity. He denies fever and sick contacts. He continues Symbicort 160 and Spiriva and is using nasal steroid and saline nasal rinses daily. He reports prednisone helped the cough, however, he has been out for 2 days and the cough has resolved. He will take a more extended prednisone taper in addition to increasing omeprazole to 40 mg once a day. We will call over to [facility] and get any available lab results in case he is eligible for a biologic. He will call with any worsening symptoms. He has a follow up appointment on 02/07/2018

## 2019-01-28 NOTE — Telephone Encounter (Signed)
Prescription for prednisone was sent in as prescribed by provider. I am contacting [facility] Sprang now to get records.  I spoke with [place] and she is faxing labs from September 2020 over to the [facility] office.

## 2019-01-28 NOTE — Telephone Encounter (Signed)
Can you please call in prednisone 10 mg tablets-take 2 tablets once a day for 5 days, then take 1 tablet once a day for 5 days. Please have him continue Symbicort 160-2 puffs twice a day with a spacer, spiriva 2 puffs once a day, Flonase and saline nasal rinses. Please have him increase omeprazole to 40 mg once a day. Please call over to [facility] at [facility] [employer] for any labs taken over the last year. Thank you

## 2019-01-28 NOTE — Progress Notes (Addendum)
Pt called clinic.  Medication sent to wrong pharmacy.  meds sent to correct pharmacy.  Call to notify [facility] to put Rx back, that [facility].

## 2019-01-28 NOTE — Telephone Encounter (Signed)
Thank you :)

## 2019-02-08 ENCOUNTER — Ambulatory Visit (INDEPENDENT_AMBULATORY_CARE_PROVIDER_SITE_OTHER): Payer: 59 | Admitting: Family Medicine

## 2019-02-08 ENCOUNTER — Encounter: Payer: Self-pay | Admitting: Family Medicine

## 2019-02-08 ENCOUNTER — Other Ambulatory Visit: Payer: Self-pay

## 2019-02-08 VITALS — BP 132/100 | HR 115 | Temp 98.0°F | Resp 16 | Ht 71.0 in | Wt 220.8 lb

## 2019-02-08 DIAGNOSIS — R05 Cough: Secondary | ICD-10-CM

## 2019-02-08 DIAGNOSIS — J3089 Other allergic rhinitis: Secondary | ICD-10-CM

## 2019-02-08 DIAGNOSIS — J4541 Moderate persistent asthma with (acute) exacerbation: Secondary | ICD-10-CM | POA: Insufficient documentation

## 2019-02-08 DIAGNOSIS — K219 Gastro-esophageal reflux disease without esophagitis: Secondary | ICD-10-CM | POA: Diagnosis not present

## 2019-02-08 DIAGNOSIS — J4540 Moderate persistent asthma, uncomplicated: Secondary | ICD-10-CM

## 2019-02-08 DIAGNOSIS — R053 Chronic cough: Secondary | ICD-10-CM

## 2019-02-08 MED ORDER — IPRATROPIUM BROMIDE 0.06 % NA SOLN: NASAL | 5 refills | Status: DC

## 2019-02-08 MED ORDER — FAMOTIDINE 20 MG PO TABS: ORAL_TABLET | ORAL | 5 refills | Status: DC

## 2019-02-08 MED ORDER — BUDESONIDE 0.5 MG/2ML IN SUSP: RESPIRATORY_TRACT | 5 refills | Status: DC

## 2019-02-08 NOTE — Patient Instructions (Addendum)
Asthma Continue Symbicort 160-2 puffs twice a day with a spacer to prevent cough or wheeze Continue Spiriva 2 puffs once a day to prevent cough or wheeze Continue albuterol 2 puffs every 4 hours as needed for cough or wheeze.  Use albuterol 2 puffs 5-15 minutes before activity  Mixed rhinitis Continue carbinoxamine 4 mg tablet 4 times a day as needed for nasal symptoms Begin budesonide 1 vial in the nasal rinse bottle twice a day. This will replace Xhance Begin Atrovent nasal spray 2 sprays in each nostril twice a day Chronic sinusitis  Reflux Continue omeprazole 40 mg once a day Begin famotidine 20 mg twice a day Continue dietary and lifestyle modifications as listed below  Chronic cough Continue the treatment plan as listed above Refer to ENT for evaluation and treatment Call the clinic if this treatment plan is not working well for you  Your blood pressure was elevated today. Follow up with your primary care provider to evaluate your blood pressure  Follow up in 2 months or sooner if needed.   Lifestyle Changes for Controlling GERD When you have GERD, stomach acid feels as if it's backing up toward your mouth. Whether or not you take medication to control your GERD, your symptoms can often be improved with lifestyle changes.   Raise Your Head  Reflux is more likely to strike when you're lying down flat, because stomach fluid can  flow backward more easily. Raising the head of your bed 4-6 inches can help. To do this:  Slide blocks or books under the legs at the head of your bed. Or, place a wedge under  the mattress. Many foam stores can make a suitable wedge for you. The wedge  should run from your waist to the top of your head.  Don't just prop your head on several pillows. This increases pressure on your  stomach. It can make GERD worse.  Watch Your Eating Habits Certain foods may increase the acid in your stomach or relax the lower esophageal sphincter, making  GERD more likely. It's best to avoid the following:  Coffee, tea, and carbonated drinks (with and without caffeine)  Fatty, fried, or spicy food  Mint, chocolate, onions, and tomatoes  Any other foods that seem to irritate your stomach or cause you pain  Relieve the Pressure  Eat smaller meals, even if you have to eat more often.  Don't lie down right after you eat. Wait a few hours for your stomach to empty.  Avoid tight belts and tight-fitting clothes.  Lose excess weight.  Tobacco and Alcohol  Avoid smoking tobacco and drinking alcohol. They can make GERD symptoms worse.

## 2019-02-08 NOTE — Progress Notes (Signed)
[address] [place] [address] Dept: [phone]  FOLLOW UP NOTE  Patient ID: Douglas Cervantes, male    DOB: [birth year]  Age: 29 y.o. MRN: [id number] Date of Office Visit: 02/08/2019  Assessment  Chief Complaint: Asthma (cough for the last few months, since weather changed has gotten worse again) and Cough (lots of phlegm)  HPI Douglas Cervantes is a 29 year old male who presents to the clinic for evaluation of cough. He was last seen in this clinic on 01/14/2019 by Dr. Nunzio Cobbs for evaluation of mixed rhinitis, chronic sinusitis, asthma, and cough.  At today's visit he reports that he continues to experience a dry cough that is worse with the weather is cold or the humidity is low.  He reports that this cough occurred in 2017, 2018, and most recently a few months ago.  He reports that the cough is worse when he is doing any activity or especially when bending over.  He denies posttussive vomiting.  He reports prednisone resolves the cough however, when the prednisone is gone, the cough returns.  Asthma is reported as moderately well controlled with no shortness of breath.  He does report frequent coughing throughout the day and wheeze with the cough.  He continues Symbicort 160-2 puffs twice a day, Spiriva 1.25 mcg 2 puffs once a day, and albuterol about once a day.  He reports that he has tried montelukast with no relief of symptoms.  Allergic rhinitis is reported as moderately well controlled with thick postnasal drainage as the main symptom.  He is currently using XHANCE 2 puffs twice a day, nasal saline rinse, and Mucinex 1200 mg twice a day with minimal relief of cough.  He began taking omeprazole 40 mg once a day over Christmas time with no perceived change in his cough.  He denies experiencing any heartburn.  He reports epistaxis daily with more bloody drainage from the right nostril than the left.  He at this has been occurring for the last 2 or 3 months.  He did increase the humidity in his  home which has helped some.  The nosebleed resolves in under 5 minutes.  His current medications are listed in the chart.   Drug Allergies:  No Known Allergies  Physical Exam: BP (!) 132/100 (BP Location: Left Arm, Patient Position: Sitting, Cuff Size: Normal)   Pulse (!) 115   Temp 98 F (36.7 C) (Temporal)   Resp 16   Ht 5\' 11"  (1.803 m)   Wt 220 lb 12.8 oz (100.2 kg)   SpO2 97%   BMI 30.80 kg/m    Physical Exam Vitals reviewed.  Constitutional:      Appearance: Normal appearance.  HENT:     Head: Normocephalic and atraumatic.     Nose:     Comments: Bilateral nares slightly erythematous with clear nasal drainage noted.  Pharynx erythematous with no exudate.  Cobblestoning noted.  Ears normal.  Eyes normal. Eyes:     Conjunctiva/sclera: Conjunctivae normal.  Cardiovascular:     Rate and Rhythm: Normal rate and regular rhythm.     Heart sounds: Normal heart sounds. No murmur.  Pulmonary:     Effort: Pulmonary effort is normal.     Breath sounds: Normal breath sounds.     Comments: Lungs clear to auscultation Musculoskeletal:        General: Normal range of motion.     Cervical back: Normal range of motion and neck supple.  Skin:    General: Skin  is warm and dry.  Neurological:     Mental Status: He is alert and oriented to person, place, and time.  Psychiatric:        Mood and Affect: Mood normal.        Behavior: Behavior normal.        Thought Content: Thought content normal.        Judgment: Judgment normal.     Diagnostics: FVC 5.09, FEV1 4.14.  Predicted FVC 5.64, predicted FEV1 4.60.  Spirometry indicates normal ventilatory function.  Assessment and Plan: 1. Cough, persistent   2. Moderate persistent asthma without complication   3. Perennial allergic rhinitis with predominantly nonallergic component   4. Gastroesophageal reflux disease, unspecified whether esophagitis present     Meds ordered this encounter  Medications  . budesonide (PULMICORT)  0.5 MG/2ML nebulizer solution    Sig: Use 1 vial in the nasal rinse bottle twice daily    Dispense:  120 mL    Refill:  5  . famotidine (PEPCID) 20 MG tablet    Sig: Take 1 tablet by mouth twice daily    Dispense:  60 tablet    Refill:  5  . ipratropium (ATROVENT) 0.06 % nasal spray    Sig: Use 2 sprays in each nostril twice daily    Dispense:  15 mL    Refill:  5    Patient Instructions  Asthma Continue Symbicort 160-2 puffs twice a day with a spacer to prevent cough or wheeze Continue Spiriva 2 puffs once a day to prevent cough or wheeze Continue albuterol 2 puffs every 4 hours as needed for cough or wheeze.  Use albuterol 2 puffs 5-15 minutes before activity  Mixed rhinitis Continue carbinoxamine 4 mg tablet 4 times a day as needed for nasal symptoms Begin budesonide 1 vial in the nasal rinse bottle twice a day. This will replace Xhance Begin Atrovent nasal spray 2 sprays in each nostril twice a day Chronic sinusitis  Reflux Continue omeprazole 40 mg once a day Begin famotidine 20 mg twice a day Continue dietary and lifestyle modifications as listed below  Chronic cough Continue the treatment plan as listed above Refer to ENT for evaluation and treatment Call the clinic if this treatment plan is not working well for you  Your blood pressure was elevated today. Follow up with your primary care provider to evaluate your blood pressure  Follow up in 2 months or sooner if needed.   Return in about 2 months (around 04/08/2019), or if symptoms worsen or fail to improve.    Thank you for the opportunity to care for this patient.  Please do not hesitate to contact me with questions.  Gareth Morgan, FNP [facility]

## 2019-02-11 ENCOUNTER — Ambulatory Visit: Payer: Managed Care, Other (non HMO) | Admitting: Allergy and Immunology

## 2019-02-12 ENCOUNTER — Telehealth: Payer: Self-pay

## 2019-02-12 NOTE — Telephone Encounter (Signed)
Referral has been faxed to [facility] for scheduling.  They will contact the patient.  Thank You

## 2019-02-12 NOTE — Telephone Encounter (Signed)
Thank you :)

## 2019-02-12 NOTE — Telephone Encounter (Signed)
-----   Message from Tawny Hopping, [place] sent at 02/08/2019  3:38 PM EST ----- Thurston Hole would like this patient to see [facility] for evaluation and treatment for chronic cough. Thank you.

## 2019-04-05 ENCOUNTER — Other Ambulatory Visit: Payer: Self-pay | Admitting: Allergy and Immunology

## 2019-04-15 ENCOUNTER — Encounter: Payer: Self-pay | Admitting: Allergy and Immunology

## 2019-04-15 ENCOUNTER — Ambulatory Visit (INDEPENDENT_AMBULATORY_CARE_PROVIDER_SITE_OTHER): Payer: 59 | Admitting: Allergy and Immunology

## 2019-04-15 ENCOUNTER — Other Ambulatory Visit: Payer: Self-pay

## 2019-04-15 DIAGNOSIS — K219 Gastro-esophageal reflux disease without esophagitis: Secondary | ICD-10-CM

## 2019-04-15 DIAGNOSIS — R053 Chronic cough: Secondary | ICD-10-CM

## 2019-04-15 DIAGNOSIS — J3089 Other allergic rhinitis: Secondary | ICD-10-CM

## 2019-04-15 DIAGNOSIS — J4540 Moderate persistent asthma, uncomplicated: Secondary | ICD-10-CM

## 2019-04-15 DIAGNOSIS — R05 Cough: Secondary | ICD-10-CM

## 2019-04-15 MED ORDER — ALBUTEROL SULFATE HFA 108 (90 BASE) MCG/ACT IN AERS: 2.0000 | INHALATION_SPRAY | RESPIRATORY_TRACT | 5 refills | Status: DC | PRN

## 2019-04-15 NOTE — Progress Notes (Signed)
Follow-up Telemedicine Note  RE: Douglas Cervantes MRN: [id number] DOB: [birth year] Date of Telemedicine Visit: 04/15/2019  Primary care provider: Soundra Pilon, FNP Referring provider: Soundra Pilon, FNP  Telemedicine Follow Up Visit via Telephone: I connected with Douglas Cervantes for a follow up on 04/15/19 by telephone and verified that I am speaking with the correct person using two identifiers.   The limitations, risks, security and privacy concerns of performing an evaluation and management service by telemedicine, the availability of in person appointments, and that there may be a patient responsible charge related to this service were discussed. The patient expressed understanding and agreed to proceed.  Patient is at home.  Provider is at the office.  Visit start time: 10:36 am Visit end time: 11:02 am Insurance consent/check in by: [facility] consent and medical assistant/nurse: DeShawn  History of present illness: Douglas Cervantes is a 29 y.o. male with a history of persistent asthma, mixed rhinitis, gastroesophageal reflux, and history of persistent cough presenting today for follow-up via telemedicine.  He was last seen in this clinic on February 08, 2019.  He reports that his cough has "improved dramatically" and that he is "doing really good" since starting gabapentin.  Dr. Jenne Pane prescribed gabapentin and adjusted the dose to 300 mg 3 times daily.  Douglas Cervantes is currently taking Symbicort 160-4.5 g, 2 inhalations via spacer device, and Spiriva 1.25 g, 2 inhalations daily.  Reports that if he misses doses of Symbicort and/or Spiriva the cough returns.  He is also continuing to use the budesonide/saline nasal lavage.  His nasal symptoms are well controlled with the budesonide saline lavage and he has not required carbinoxamine and/or ipratropium nasal spray recently.  He is currently taking omeprazole 40 mg once daily for reflux.  Assessment and plan: Cough, persistent Improved on current  regimen.  Continue gabapentin as prescribed and treatment plan as outlined below.  Moderate persistent asthma Stable.  Continue Symbicort 160-4.5 g, 2 inhalations via spacer device twice daily, Spiriva Respimat 1.25 g, 2 inhalations daily, albuterol HFA, 1 to 2 inhalations every 4-6 hours if needed.  A refill prescription has been provided for albuterol HFA.  Subjective and objective measures of pulmonary function will be followed and the treatment plan will be adjusted accordingly.  Perennial allergic rhinitis with predominantly nonallergic component  Continue appropriate allergen avoidance measures and budesonide/saline rinse as needed.  May add carbinoxamine and/or Atrovent nasal spray if needed.  Gastroesophageal reflux disease Well-controlled.  Continue appropriate reflux lifestyle modifications.  Attempt to decrease omeprazole from 40 mg daily to 20 mg daily.  If reflux and/or coughing progress in frequency and/or severity, the patient is to resume the previous dose.  Famotidine (Pepcid) 20 mg twice daily if needed.   Meds ordered this encounter  Medications  . albuterol (VENTOLIN HFA) 108 (90 Base) MCG/ACT inhaler    Sig: Inhale 2 puffs into the lungs every 4 (four) hours as needed for wheezing or shortness of breath.    Dispense:  18 g    Refill:  5    Diagnostics: None.  Physical examination: Physical Exam Not obtained as encounter was done via telephone.   The following portions of the patient's history were reviewed and updated as appropriate: allergies, current medications, past family history, past medical history, past social history, past surgical history and problem list.  Allergies as of 04/15/2019   No Known Allergies     Medication List       Accurate as of April 15, 2019 12:00 PM. If you have any questions, ask your nurse or doctor.        acetaminophen 500 MG tablet Commonly known as: TYLENOL Take 500 mg by mouth every 6 (six) hours as  needed.   albuterol 108 (90 Base) MCG/ACT inhaler Commonly known as: VENTOLIN HFA Inhale into the lungs. What changed: Another medication with the same name was changed. Make sure you understand how and when to take each. Changed by: Wellington Hampshire, MD   albuterol 108 (90 Base) MCG/ACT inhaler Commonly known as: VENTOLIN HFA Inhale 2 puffs into the lungs every 4 (four) hours as needed for wheezing or shortness of breath. What changed:   how much to take  when to take this  reasons to take this Changed by: R Jorene Guest, MD   budesonide 0.5 MG/2ML nebulizer solution Commonly known as: PULMICORT Use 1 vial in the nasal rinse bottle twice daily   budesonide-formoterol 160-4.5 MCG/ACT inhaler Commonly known as: SYMBICORT Inhale 2 puffs into the lungs 2 (two) times daily.   buPROPion 150 MG 24 hr tablet Commonly known as: WELLBUTRIN XL Take 150 mg by mouth every morning.   Carbinoxamine Maleate 4 MG Tabs Take 1 tablet (4 mg total) by mouth every 6 (six) hours as needed.   famotidine 20 MG tablet Commonly known as: PEPCID Take 1 tablet by mouth twice daily   fluticasone 50 MCG/ACT nasal spray Commonly known as: FLONASE Place 2 sprays into both nostrils daily. As needed.   gabapentin 100 MG capsule Commonly known as: NEURONTIN Take by mouth.   guaiFENesin 600 MG 12 hr tablet Commonly known as: MUCINEX Take 1,200 mg by mouth 2 (two) times daily.   ipratropium 0.06 % nasal spray Commonly known as: ATROVENT Use 2 sprays in each nostril twice daily   ipratropium 0.06 % nasal spray Commonly known as: ATROVENT Use 2 sprays in each nostril twice daily   losartan 50 MG tablet Commonly known as: COZAAR Take 50 mg by mouth daily.   omeprazole 40 MG capsule Commonly known as: PRILOSEC Take 40 mg by mouth daily.   pseudoephedrine 120 MG 12 hr tablet Commonly known as: SUDAFED Take 120 mg by mouth 2 (two) times daily.   Spiriva Respimat 1.25 MCG/ACT  Aers Generic drug: Tiotropium Bromide Monohydrate INHALE 2 PUFFS BY MOUTH INTO THE LUNGS DAILY       No Known Allergies  Review of systems: Review of systems negative except as noted in HPI / PMHx.  Past Medical History:  Diagnosis Date  . Allergy   . Asthma   . Recurrent upper respiratory infection (URI)     Family History  Problem Relation Age of Onset  . Allergic rhinitis Father   . Asthma Paternal Grandmother   . Angioedema Neg Hx   . Atopy Neg Hx   . Immunodeficiency Neg Hx   . Urticaria Neg Hx   . Eczema Neg Hx     Social History   Socioeconomic History  . Marital status: Single    Spouse name: Not on file  . Number of children: Not on file  . Years of education: Not on file  . Highest education level: Not on file  Occupational History  . Not on file  Tobacco Use  . Smoking status: Never Smoker  . Smokeless tobacco: Never Used  Substance and Sexual Activity  . Alcohol use: Yes    Alcohol/week: 0.0 standard drinks    Comment: 3-4 times a week.   Marland Kitchen  Drug use: No  . Sexual activity: Not on file  Other Topics Concern  . Not on file  Social History Narrative  . Not on file   Social Determinants of Health   Financial Resource Strain:   . Difficulty of Paying Living Expenses:   Food Insecurity:   . Worried About Charity fundraiser in the Last Year:   . Arboriculturist in the Last Year:   Transportation Needs:   . Film/video editor (Medical):   Marland Kitchen Lack of Transportation (Non-Medical):   Physical Activity:   . Days of Exercise per Week:   . Minutes of Exercise per Session:   Stress:   . Feeling of Stress :   Social Connections:   . Frequency of Communication with Friends and Family:   . Frequency of Social Gatherings with Friends and Family:   . Attends Religious Services:   . Active Member of Clubs or Organizations:   . Attends Archivist Meetings:   Marland Kitchen Marital Status:   Intimate Partner Violence:   . Fear of Current or  Ex-Partner:   . Emotionally Abused:   Marland Kitchen Physically Abused:   . Sexually Abused:     Previous notes and tests were reviewed.  I discussed the assessment and treatment plan with the patient. The patient was provided an opportunity to ask questions and all were answered. The patient agreed with the plan and demonstrated an understanding of the instructions.   The patient was advised to call back or seek an in-person evaluation if the symptoms worsen or if the condition fails to improve as anticipated.  I provided 28 minutes of non-face-to-face time during this encounter.  I appreciate the opportunity to take part in Joeph's care. Please do not hesitate to contact me with questions.  Sincerely,   R. Edgar Frisk, MD

## 2019-04-15 NOTE — Assessment & Plan Note (Signed)
   Continue appropriate allergen avoidance measures and budesonide/saline rinse as needed.  May add carbinoxamine and/or Atrovent nasal spray if needed.

## 2019-04-15 NOTE — Assessment & Plan Note (Signed)
Stable.  Continue Symbicort 160-4.5 g, 2 inhalations via spacer device twice daily, Spiriva Respimat 1.25 g, 2 inhalations daily, albuterol HFA, 1 to 2 inhalations every 4-6 hours if needed.  A refill prescription has been provided for albuterol HFA.  Subjective and objective measures of pulmonary function will be followed and the treatment plan will be adjusted accordingly.

## 2019-04-15 NOTE — Assessment & Plan Note (Signed)
Well-controlled.  Continue appropriate reflux lifestyle modifications.  Attempt to decrease omeprazole from 40 mg daily to 20 mg daily.  If reflux and/or coughing progress in frequency and/or severity, the patient is to resume the previous dose.  Famotidine (Pepcid) 20 mg twice daily if needed.

## 2019-04-15 NOTE — Assessment & Plan Note (Signed)
Improved on current regimen.  Continue gabapentin as prescribed and treatment plan as outlined below.

## 2019-04-15 NOTE — Patient Instructions (Signed)
Cough, persistent Improved on current regimen.  Continue gabapentin as prescribed and treatment plan as outlined below.  Moderate persistent asthma Stable.  Continue Symbicort 160-4.5 g, 2 inhalations via spacer device twice daily, Spiriva Respimat 1.25 g, 2 inhalations daily, albuterol HFA, 1 to 2 inhalations every 4-6 hours if needed.  A refill prescription has been provided for albuterol HFA.  Subjective and objective measures of pulmonary function will be followed and the treatment plan will be adjusted accordingly.  Perennial allergic rhinitis with predominantly nonallergic component  Continue appropriate allergen avoidance measures and budesonide/saline rinse as needed.  May add carbinoxamine and/or Atrovent nasal spray if needed.  Gastroesophageal reflux disease Well-controlled.  Continue appropriate reflux lifestyle modifications.  Attempt to decrease omeprazole from 40 mg daily to 20 mg daily.  If reflux and/or coughing progress in frequency and/or severity, the patient is to resume the previous dose.  Famotidine (Pepcid) 20 mg twice daily if needed.   Return in about 4 months (around 08/15/2019), or if symptoms worsen or fail to improve.

## 2019-04-20 ENCOUNTER — Other Ambulatory Visit: Payer: Self-pay | Admitting: Allergy and Immunology

## 2019-05-15 ENCOUNTER — Other Ambulatory Visit: Payer: Self-pay | Admitting: Allergy and Immunology

## 2019-05-18 ENCOUNTER — Other Ambulatory Visit: Payer: Self-pay | Admitting: Family Medicine

## 2019-06-03 ENCOUNTER — Ambulatory Visit (INDEPENDENT_AMBULATORY_CARE_PROVIDER_SITE_OTHER): Payer: 59

## 2019-06-03 ENCOUNTER — Encounter: Payer: Self-pay | Admitting: Podiatrist

## 2019-06-03 ENCOUNTER — Ambulatory Visit (INDEPENDENT_AMBULATORY_CARE_PROVIDER_SITE_OTHER): Payer: 59 | Admitting: Orthotics

## 2019-06-03 ENCOUNTER — Other Ambulatory Visit: Payer: Self-pay

## 2019-06-03 ENCOUNTER — Ambulatory Visit (INDEPENDENT_AMBULATORY_CARE_PROVIDER_SITE_OTHER): Payer: 59 | Admitting: Podiatrist

## 2019-06-03 DIAGNOSIS — Q6671 Congenital pes cavus, right foot: Secondary | ICD-10-CM

## 2019-06-03 DIAGNOSIS — Q667 Congenital pes cavus, unspecified foot: Secondary | ICD-10-CM

## 2019-06-03 DIAGNOSIS — Q6672 Congenital pes cavus, left foot: Secondary | ICD-10-CM | POA: Diagnosis not present

## 2019-06-03 DIAGNOSIS — M2141 Flat foot [pes planus] (acquired), right foot: Secondary | ICD-10-CM

## 2019-06-03 NOTE — Patient Instructions (Signed)

## 2019-06-03 NOTE — Progress Notes (Signed)
  Chief Complaint  Patient presents with  . Foot Orthotics    pt is looking to get new orthotics, pt states that the last ones he's had, has been 6 years ago, pt states that he has a hard time walking without them.     HPI: Patient is 29 y.o. male who presents today for the concerns as listed above.  Patient states that his orthotics are 30 years old and they are starting to flatten out.  He is happy with the current style of orthotic which is full-length leather top cover with intrinsic posting.   Review of Systems No fevers, chills, nausea, muscle aches, no difficulty breathing, no calf pain, no chest pain or shortness of breath.   Physical Exam  GENERAL APPEARANCE: Alert, conversant. Appropriately groomed. No acute distress.   VASCULAR: Pedal pulses palpable DP and PT bilateral.  Capillary refill time is immediate to all digits,  Proximal to distal cooling it warm to warm.  Digital hair growth is present bilateral   NEUROLOGIC: sensation is intact epicritically and protectively to 5.07 monofilament at 5/5 sites bilateral.  Light touch is intact bilateral, vibratory sensation intact bilateral, achilles tendon reflex is intact bilateral.   MUSCULOSKELETAL: acceptable muscle strength, tone and stability bilateral.  No gross boney pedal deformities noted.  No pain, crepitus or limitation noted with foot and ankle range of motion bilateral.  High arch foot type is noted.  Increased arch height at midtarsal joint bilaterally is seen.  Leg pain reported when not wearing orthotics  DERMATOLOGIC: skin is warm, supple, and dry.  No open lesions noted.  No rash, no pre ulcerative lesions. Digital nails are asymptomatic.    Radiographic exam:  Normal osseous mineralization.  No fracture or dislocation or acute osseous abnormalities present.  Joint spaces are normal.    Assessment   Cavus foot type, tendinitis  Plan  Recommended new orthotics and he was casted at today's visit.  He will be  notified when the orthotics are ready for pickup.  If any concerns arise prior to that visit he will call.

## 2019-06-03 NOTE — Progress Notes (Signed)

## 2019-06-04 ENCOUNTER — Other Ambulatory Visit: Payer: Self-pay | Admitting: Podiatrist

## 2019-06-04 DIAGNOSIS — Q667 Congenital pes cavus, unspecified foot: Secondary | ICD-10-CM

## 2019-06-25 ENCOUNTER — Ambulatory Visit: Payer: 59 | Admitting: Orthotics

## 2019-06-25 ENCOUNTER — Other Ambulatory Visit: Payer: Self-pay

## 2019-06-25 DIAGNOSIS — Q667 Congenital pes cavus, unspecified foot: Secondary | ICD-10-CM

## 2019-06-25 NOTE — Progress Notes (Signed)
Patient came in to p/u foot orthoics to address cavus foot type'  He was pleased with fit and will probablyh want second pair.

## 2019-07-13 ENCOUNTER — Other Ambulatory Visit: Payer: Self-pay | Admitting: Allergy and Immunology

## 2019-09-16 ENCOUNTER — Ambulatory Visit (INDEPENDENT_AMBULATORY_CARE_PROVIDER_SITE_OTHER): Payer: 59 | Admitting: Allergy and Immunology

## 2019-09-16 ENCOUNTER — Other Ambulatory Visit: Payer: Self-pay

## 2019-09-16 ENCOUNTER — Encounter: Payer: Self-pay | Admitting: Allergy and Immunology

## 2019-09-16 DIAGNOSIS — R05 Cough: Secondary | ICD-10-CM | POA: Diagnosis not present

## 2019-09-16 DIAGNOSIS — J4540 Moderate persistent asthma, uncomplicated: Secondary | ICD-10-CM | POA: Diagnosis not present

## 2019-09-16 DIAGNOSIS — J3089 Other allergic rhinitis: Secondary | ICD-10-CM | POA: Diagnosis not present

## 2019-09-16 DIAGNOSIS — R053 Chronic cough: Secondary | ICD-10-CM

## 2019-09-16 MED ORDER — SPACER/AERO-HOLDING CHAMBERS DEVI: 1 refills | Status: DC

## 2019-09-16 MED ORDER — AZELASTINE HCL 0.1 % NA SOLN
NASAL | 5 refills | Status: DC
Start: 2019-09-16 — End: 2021-05-19

## 2019-09-16 NOTE — Patient Instructions (Addendum)
Cough, persistent Based on the history, the patient's history suggests primarily neurogenic cough plus or minus other contributing factors.  Continue gabapentin 200 mg 3 times daily as prescribed by Dr. Jenne Pane.  We will begin stepping down asthma therapy to find lowest effective dose.  Follow-up with Dr. Jenne Pane.  Moderate persistent asthma  Begin stepping down therapy 1 at a time to find lowest effective dose.  Initially, hold Spiriva.  If symptoms recur/progress, resume previous dose.  For now, continue Symbicort 160-4.5 g, 2 inhalations via spacer device twice daily.  Eventually, may step down to 1 inhalation twice daily and then hold if symptoms allow.  Continue albuterol HFA, 1 to 2 inhalations every 4-6 hours if needed.  Subjective and objective measures of pulmonary function will be followed and the treatment plan will be adjusted accordingly.  Perennial allergic rhinitis with predominantly nonallergic component  A prescription has been provided for azelastine nasal spray, 1-2 sprays per nostril 2 times daily as needed. Proper nasal spray technique has been discussed and demonstrated.   Nasal saline spray (i.e., Simply Saline) or nasal saline lavage (i.e., NeilMed) is recommended as needed and prior to medicated nasal sprays.  For thick post nasal drainage, add guaifenesin 1200 mg (Mucinex Maximum Strength)  twice daily as needed with adequate hydration as discussed.   Return in about 3 months (around 12/17/2019), or if symptoms worsen or fail to improve.

## 2019-09-16 NOTE — Assessment & Plan Note (Signed)
Based on the history, the patient's history suggests primarily neurogenic cough plus or minus other contributing factors.  Continue gabapentin 200 mg 3 times daily as prescribed by Dr. Jenne Pane.  We will begin stepping down asthma therapy to find lowest effective dose.  Follow-up with Dr. Jenne Pane.

## 2019-09-16 NOTE — Progress Notes (Signed)
Follow-up Telemedicine Note  RE: Douglas Cervantes MRN: [id number] DOB: 1990/07/12 Date of Telemedicine Visit: 09/16/2019  Primary care provider: Soundra Pilon, FNP Referring provider: Soundra Pilon, FNP  Telemedicine Follow Up Visit via Telephone: I connected with Meda Coffee for a follow up on 09/16/19 by telephone and verified that I am speaking with the correct person using two identifiers.   The limitations, risks, security and privacy concerns of performing an evaluation and management service by telemedicine, the availability of in person appointments, and that there may be a patient responsible charge related to this service were discussed. The patient expressed understanding and agreed to proceed.  Patient is at home.  Provider is at the office.  Visit start time: 10:36 AM Visit end time: 11:31 AM Insurance consent/check in by: [facility] Medical consent and medical assistant/nurse: [place]  History of present illness: Douglas Cervantes is a 29 y.o. male with persistent asthma and allergic rhinitis presented today for follow-up.  He was last evaluated in this clinic via telemedicine on April 15, 2019.  He reports that while taking gabapentin 200 mg 3 times a day he has "no cough at all."  However, if he misses a dose, or is even delayed in taking 1 of those doses by few hours, the cough begins to return.  The gabapentin was prescribed by his otolaryngologist, Dr. Jenne Pane.  He is currently still taking Symbicort 160-4.5 micro grams, 2 inhalations twice daily.  He notes that his spacer device broke and that he needs a new one.  He discontinued carbinoxamine because of lack of perceived benefit.  He also discontinued budesonide/saline rinses.  He is guaifenesin sporadically, and occasionally pseudoephedrine.  Assessment and plan: Cough, persistent Based on the history, the patient's history suggests primarily neurogenic cough plus or minus other contributing factors.  Continue gabapentin 200 mg 3  times daily as prescribed by Dr. Jenne Pane.  We will begin stepping down asthma therapy to find lowest effective dose.  Follow-up with Dr. Jenne Pane.  Moderate persistent asthma  Begin stepping down therapy 1 at a time to find lowest effective dose.  Initially, hold Spiriva.  If symptoms recur/progress, resume previous dose.  For now, continue Symbicort 160-4.5 g, 2 inhalations via spacer device twice daily.  Eventually, may step down to 1 inhalation twice daily and then hold if symptoms allow.  Continue albuterol HFA, 1 to 2 inhalations every 4-6 hours if needed.  Subjective and objective measures of pulmonary function will be followed and the treatment plan will be adjusted accordingly.  Perennial allergic rhinitis with predominantly nonallergic component  A prescription has been provided for azelastine nasal spray, 1-2 sprays per nostril 2 times daily as needed. Proper nasal spray technique has been discussed and demonstrated.   Nasal saline spray (i.e., Simply Saline) or nasal saline lavage (i.e., NeilMed) is recommended as needed and prior to medicated nasal sprays.  For thick post nasal drainage, add guaifenesin 1200 mg (Mucinex Maximum Strength)  twice daily as needed with adequate hydration as discussed.   Meds ordered this encounter  Medications  . azelastine (ASTELIN) 0.1 % nasal spray    Sig: 1-2 sprays per nostril twice daily for runny nose    Dispense:  30 mL    Refill:  5  . Spacer/Aero-Holding Chambers DEVI    Sig: Use as directed    Dispense:  1 Device    Refill:  1    Diagnostics: None.   Physical examination: Physical Exam Not obtained as encounter was  done via telephone.   The following portions of the patient's history were reviewed and updated as appropriate: allergies, current medications, past family history, past medical history, past social history, past surgical history and problem list.  Allergies as of 09/16/2019   No Known Allergies      Medication List       Accurate as of September 16, 2019  3:49 PM. If you have any questions, ask your nurse or doctor.        acetaminophen 500 MG tablet Commonly known as: TYLENOL Take 500 mg by mouth every 6 (six) hours as needed.   albuterol 108 (90 Base) MCG/ACT inhaler Commonly known as: VENTOLIN HFA Inhale into the lungs.   albuterol 108 (90 Base) MCG/ACT inhaler Commonly known as: VENTOLIN HFA Inhale 2 puffs into the lungs every 4 (four) hours as needed for wheezing or shortness of breath.   azelastine 0.1 % nasal spray Commonly known as: ASTELIN 1-2 sprays per nostril twice daily for runny nose Started by: Wellington Hampshire, MD   budesonide 0.5 MG/2ML nebulizer solution Commonly known as: PULMICORT Use 1 vial in the nasal rinse bottle twice daily   buPROPion 150 MG 24 hr tablet Commonly known as: WELLBUTRIN XL Take 150 mg by mouth every morning.   Carbinoxamine Maleate 4 MG Tabs Take 1 tablet (4 mg total) by mouth every 6 (six) hours as needed.   famotidine 20 MG tablet Commonly known as: PEPCID TAKE 1 TABLET BY MOUTH TWICE A DAY   fluticasone 50 MCG/ACT nasal spray Commonly known as: FLONASE USE 2 SPRAYS IN EACH NOSTRIL EVERY DAY AS NEEDED   gabapentin 100 MG capsule Commonly known as: NEURONTIN Take by mouth.   guaiFENesin 600 MG 12 hr tablet Commonly known as: MUCINEX Take 1,200 mg by mouth 2 (two) times daily.   ipratropium 0.06 % nasal spray Commonly known as: ATROVENT Use 2 sprays in each nostril twice daily   ipratropium 0.06 % nasal spray Commonly known as: ATROVENT Use 2 sprays in each nostril twice daily   losartan 50 MG tablet Commonly known as: COZAAR Take 50 mg by mouth daily.   omeprazole 40 MG capsule Commonly known as: PRILOSEC Take 40 mg by mouth daily.   pseudoephedrine 120 MG 12 hr tablet Commonly known as: SUDAFED Take 120 mg by mouth 2 (two) times daily.   Spacer/Aero-Holding [employer] Use as directed Started by:  Wellington Hampshire, MD   Spiriva Respimat 1.25 MCG/ACT Aers Generic drug: Tiotropium Bromide Monohydrate INHALE 2 PUFFS BY MOUTH INTO THE LUNGS DAILY   Symbicort 160-4.5 MCG/ACT inhaler Generic drug: budesonide-formoterol INHALE 2 PUFFS INTO THE LUNGS TWICE A DAY       No Known Allergies  Review of systems: Review of systems negative except as noted in HPI / PMHx.  Past Medical History:  Diagnosis Date  . Allergy   . Asthma   . Recurrent upper respiratory infection (URI)     Family History  Problem Relation Age of Onset  . Allergic rhinitis Father   . Asthma Paternal Grandmother   . Angioedema Neg Hx   . Atopy Neg Hx   . Immunodeficiency Neg Hx   . Urticaria Neg Hx   . Eczema Neg Hx     Social History   Socioeconomic History  . Marital status: Single    Spouse name: Not on file  . Number of children: Not on file  . Years of education: Not on file  . Highest education  level: Not on file  Occupational History  . Not on file  Tobacco Use  . Smoking status: Never Smoker  . Smokeless tobacco: Never Used  Vaping Use  . Vaping Use: Never used  Substance and Sexual Activity  . Alcohol use: Yes    Alcohol/week: 0.0 standard drinks    Comment: 3-4 times a week.   . Drug use: No  . Sexual activity: Yes  Other Topics Concern  . Not on file  Social History Narrative  . Not on file   Social Determinants of Health   Financial Resource Strain:   . Difficulty of Paying Living Expenses: Not on file  Food Insecurity:   . Worried About Programme researcher, broadcasting/film/video in the Last Year: Not on file  . Ran Out of Food in the Last Year: Not on file  Transportation Needs:   . Lack of Transportation (Medical): Not on file  . Lack of Transportation (Non-Medical): Not on file  Physical Activity:   . Days of Exercise per Week: Not on file  . Minutes of Exercise per Session: Not on file  Stress:   . Feeling of Stress : Not on file  Social Connections:   . Frequency of Communication  with Friends and Family: Not on file  . Frequency of Social Gatherings with Friends and Family: Not on file  . Attends Religious Services: Not on file  . Active Member of Clubs or Organizations: Not on file  . Attends Banker Meetings: Not on file  . Marital Status: Not on file  Intimate Partner Violence:   . Fear of Current or Ex-Partner: Not on file  . Emotionally Abused: Not on file  . Physically Abused: Not on file  . Sexually Abused: Not on file    Previous notes and tests were reviewed.  I discussed the assessment and treatment plan with the patient. The patient was provided an opportunity to ask questions and all were answered. The patient agreed with the plan and demonstrated an understanding of the instructions.   The patient was advised to call back or seek an in-person evaluation if the symptoms worsen or if the condition fails to improve as anticipated.  I provided 30 minutes of non-face-to-face time during this encounter.  I appreciate the opportunity to take part in Hakop's care. Please do not hesitate to contact me with questions.  Sincerely,   R. Jorene Guest, MD

## 2019-09-16 NOTE — Assessment & Plan Note (Signed)
   A prescription has been provided for azelastine nasal spray, 1-2 sprays per nostril 2 times daily as needed. Proper nasal spray technique has been discussed and demonstrated.   Nasal saline spray (i.e., Simply Saline) or nasal saline lavage (i.e., NeilMed) is recommended as needed and prior to medicated nasal sprays.  For thick post nasal drainage, add guaifenesin 1200 mg (Mucinex Maximum Strength)  twice daily as needed with adequate hydration as discussed.

## 2019-09-16 NOTE — Assessment & Plan Note (Signed)
   Begin stepping down therapy 1 at a time to find lowest effective dose.  Initially, hold Spiriva.  If symptoms recur/progress, resume previous dose.  For now, continue Symbicort 160-4.5 g, 2 inhalations via spacer device twice daily.  Eventually, may step down to 1 inhalation twice daily and then hold if symptoms allow.  Continue albuterol HFA, 1 to 2 inhalations every 4-6 hours if needed.  Subjective and objective measures of pulmonary function will be followed and the treatment plan will be adjusted accordingly.

## 2019-12-17 ENCOUNTER — Ambulatory Visit: Payer: Self-pay | Admitting: Allergy and Immunology

## 2020-05-23 ENCOUNTER — Other Ambulatory Visit: Payer: Self-pay | Admitting: Family Medicine

## 2020-06-09 ENCOUNTER — Telehealth: Payer: Self-pay | Admitting: Podiatrist

## 2020-06-09 DIAGNOSIS — Q6671 Congenital pes cavus, right foot: Secondary | ICD-10-CM

## 2020-06-09 NOTE — Telephone Encounter (Signed)
Pt sent a message online asking about getting 1 or 2 more pairs of orthotics.  I returned call and pt states he is wanting to get at least  1 more pair but his current ones that are dress slims (Sulcus) are starting to peel up a little at the forefoot area. I explained that it maybe because it causes more friction with the foot. He stated he has been wearing orthotics for 15 yrs and has never had any of them do this.   I did take to [facility]) and she said to send it back and they will recover it. I told pt and he may just order another pair and then send it back because he cannot go without the orthotics. He is going to stop in to look at the different styles and let me know which ones he is going to order

## 2020-06-12 ENCOUNTER — Other Ambulatory Visit: Payer: Self-pay | Admitting: Family Medicine

## 2020-06-23 ENCOUNTER — Telehealth: Payer: Self-pay | Admitting: Podiatrist

## 2020-06-23 NOTE — Telephone Encounter (Signed)
2 pr orthotics in.. lvm for pt ok to pick up orthotics.Marland Kitchen

## 2020-07-07 ENCOUNTER — Telehealth: Payer: Self-pay | Admitting: Podiatrist

## 2020-07-07 NOTE — Telephone Encounter (Signed)
Warranty orthotics are in.. pt aware ok to pick up.

## 2020-10-08 ENCOUNTER — Other Ambulatory Visit: Payer: Self-pay | Admitting: Family Medicine

## 2020-11-12 DIAGNOSIS — F5101 Primary insomnia: Secondary | ICD-10-CM | POA: Diagnosis not present

## 2020-11-12 DIAGNOSIS — K219 Gastro-esophageal reflux disease without esophagitis: Secondary | ICD-10-CM | POA: Diagnosis not present

## 2020-11-12 DIAGNOSIS — Z23 Encounter for immunization: Secondary | ICD-10-CM | POA: Diagnosis not present

## 2020-11-12 DIAGNOSIS — I10 Essential (primary) hypertension: Secondary | ICD-10-CM | POA: Diagnosis not present

## 2020-11-12 DIAGNOSIS — E782 Mixed hyperlipidemia: Secondary | ICD-10-CM | POA: Diagnosis not present

## 2020-11-12 DIAGNOSIS — Z0000 Encounter for general adult medical examination without abnormal findings: Secondary | ICD-10-CM | POA: Diagnosis not present

## 2020-12-03 DIAGNOSIS — F330 Major depressive disorder, recurrent, mild: Secondary | ICD-10-CM | POA: Diagnosis not present

## 2020-12-03 DIAGNOSIS — F411 Generalized anxiety disorder: Secondary | ICD-10-CM | POA: Diagnosis not present

## 2020-12-07 DIAGNOSIS — F411 Generalized anxiety disorder: Secondary | ICD-10-CM | POA: Diagnosis not present

## 2020-12-07 DIAGNOSIS — F3341 Major depressive disorder, recurrent, in partial remission: Secondary | ICD-10-CM | POA: Diagnosis not present

## 2020-12-18 DIAGNOSIS — M79674 Pain in right toe(s): Secondary | ICD-10-CM | POA: Diagnosis not present

## 2021-01-04 DIAGNOSIS — F3341 Major depressive disorder, recurrent, in partial remission: Secondary | ICD-10-CM | POA: Diagnosis not present

## 2021-02-15 DIAGNOSIS — J0100 Acute maxillary sinusitis, unspecified: Secondary | ICD-10-CM | POA: Diagnosis not present

## 2021-02-24 NOTE — Telephone Encounter (Signed)
error 

## 2021-03-10 DIAGNOSIS — I10 Essential (primary) hypertension: Secondary | ICD-10-CM | POA: Diagnosis not present

## 2021-03-10 DIAGNOSIS — F3341 Major depressive disorder, recurrent, in partial remission: Secondary | ICD-10-CM | POA: Diagnosis not present

## 2021-03-10 DIAGNOSIS — F411 Generalized anxiety disorder: Secondary | ICD-10-CM | POA: Diagnosis not present

## 2021-04-08 DIAGNOSIS — F3341 Major depressive disorder, recurrent, in partial remission: Secondary | ICD-10-CM | POA: Diagnosis not present

## 2021-05-05 ENCOUNTER — Other Ambulatory Visit: Payer: Self-pay | Admitting: *Deleted

## 2021-05-05 ENCOUNTER — Telehealth: Payer: Self-pay | Admitting: Family Medicine

## 2021-05-05 MED ORDER — ALBUTEROL SULFATE HFA 108 (90 BASE) MCG/ACT IN AERS: 2.0000 | INHALATION_SPRAY | RESPIRATORY_TRACT | 0 refills | Status: DC | PRN

## 2021-05-05 NOTE — Telephone Encounter (Signed)
Courtesy refill has been sent in. No more refills until appointment.  ?

## 2021-05-05 NOTE — Telephone Encounter (Signed)
Pt called requesting a refill for his rescue inhaler. I let patient know he had not had a visit since 09-16-2019 which was a televisit. I advised patient he would need a visit in order to get any refills.  ? ?Patient scheduled an appointment for 05-19-2021 with Dr. Maurine Minister. I advised patient a 30 courtesy refill can be sent in & if for any reason he cancels/reschedules this appointment we will not send more refills. Patient verbalized understanding.  ? ?[facility] - [address] [place] [address] ? ?

## 2021-05-17 NOTE — Progress Notes (Signed)
? ?FOLLOW UP ?Date of Service/Encounter:  05/19/21 ? ? ?Subjective:  ?Douglas CoffeeDavid Cervantes (DOB: December 31, 1990) is a 31 y.o. male who [facility] in re-evaluation of the following: asthma and allergic rhinitis ?History obtained from: chart review and patient. ? ?For Review, LV was on 09/16/19  with Dr. Nunzio CobbsBobbitt seen for regular follow-up via televisit.  Taking gabapentin prescribed by Dr. Jenne PaneBates ENT for chronic cough.  Using Symbicort 160, 2 puffs BID, plan to step down as cough thought mostly secondary to neurogenic cause.   ? ?Today presents for follow-up. ?At his last visit, he was having issues with chronic cough. ?He was using gabapentin for neurogenic cough prescribed by his ENT.  He was able to wean off the gabapentin about one year ago.  He was up to 600 mg daily (200 mg TID) at his med.  His cough seems to have resolved, and this has not been an issue for the past year.  During this time, he also weaned off his Symbicort 160.  His asthma seems to be much better controlled as well. ? ?He is recently getting over a sinus congestion, and is a little wheezy with some cough with exertion.  Now using albuterol as needed which is usually only once every few months.  Currently his issue has been nasal congestion. He will take sudafed for bad sinus congestion.  He will do it daily some months, and then doesn't use it again for months.  Congestion with sinus pressure and pain are his main symptoms.  Over the past few days this has overall been well controlled.  He is mostly here to get a refill on his rescue inhaler for an upcoming trip. ? ? ?Allergies as of 05/19/2021   ?No Known Allergies ?  ? ?  ?Medication List  ?  ? ?  ? Accurate as of May 19, 2021 11:58 AM. If you have any questions, ask your nurse or doctor.  ?  ?  ? ?  ? ?STOP taking these medications   ? ?azelastine 0.1 % nasal spray ?Commonly known as: ASTELIN ?Stopped by: Tonny BollmanErin Pepe Mineau, MD ?  ?budesonide 0.5 MG/2ML nebulizer  solution ?Commonly known as: PULMICORT ?Stopped by: Tonny BollmanErin Tamaiya Bump, MD ?  ?Carbinoxamine Maleate 4 MG Tabs ?Stopped by: Tonny BollmanErin Aerica Rincon, MD ?  ?fluticasone 50 MCG/ACT nasal spray ?Commonly known as: FLONASE ?Stopped by: Tonny BollmanErin Eara Burruel, MD ?  ?gabapentin 100 MG capsule ?Commonly known as: NEURONTIN ?Stopped by: Tonny BollmanErin Desare Duddy, MD ?  ?guaiFENesin 600 MG 12 hr tablet ?Commonly known as: MUCINEX ?Stopped by: Tonny BollmanErin Van Ehlert, MD ?  ?ipratropium 0.06 % nasal spray ?Commonly known as: ATROVENT ?Stopped by: Tonny BollmanErin Jilliane Kazanjian, MD ?  ?omeprazole 40 MG capsule ?Commonly known as: PRILOSEC ?Stopped by: Tonny BollmanErin Larnell Granlund, MD ?  ?pseudoephedrine 120 MG 12 hr tablet ?Commonly known as: SUDAFED ?Stopped by: Tonny BollmanErin Brantlee Penn, MD ?  ?Spacer/Aero-Holding Rudean Curthambers Devi ?Stopped by: Tonny BollmanErin Verdon Ferrante, MD ?  ?Spiriva Respimat 1.25 MCG/ACT Aers ?Generic drug: Tiotropium Bromide Monohydrate ?Stopped by: Tonny BollmanErin Brittne Kawasaki, MD ?  ?Symbicort 160-4.5 MCG/ACT inhaler ?Generic drug: budesonide-formoterol ?Stopped by: Tonny BollmanErin Ladislav Caselli, MD ?  ? ?  ? ?TAKE these medications   ? ?acetaminophen 500 MG tablet ?Commonly known as: TYLENOL ?Take 500 mg by mouth every 6 (six) hours as needed. ?  ?albuterol 108 (90 Base) MCG/ACT inhaler ?Commonly known as: VENTOLIN HFA ?Inhale into the lungs. ?  ?albuterol 108 (90 Base) MCG/ACT inhaler ?Commonly known as: VENTOLIN HFA ?Inhale 2 puffs into the  lungs every 4 (four) hours as needed for wheezing or shortness of breath. ?  ?buPROPion 150 MG 24 hr tablet ?Commonly known as: WELLBUTRIN XL ?Take 150 mg by mouth every morning. ?  ?famotidine 20 MG tablet ?Commonly known as: PEPCID ?TAKE 1 TABLET BY MOUTH TWICE A DAY ?  ?losartan 50 MG tablet ?Commonly known as: COZAAR ?Take 50 mg by mouth daily. ?  ? ?  ? ?Past Medical History:  ?Diagnosis Date  ? Allergy   ? Asthma   ? Recurrent upper respiratory infection (URI)   ? ?Past Surgical History:  ?Procedure Laterality Date  ? FRACTURE SURGERY    ? WISDOM TOOTH EXTRACTION    ? ?Otherwise, there have been no changes to  his past medical history, surgical history, family history, or social history. ? ?ROS: All others negative except as noted per HPI.  ? ?Objective:  ?BP 124/82   Pulse (!) 102   Temp 98.2 ?F (36.8 ?C)   Resp 18   Ht 6' (1.829 m)   Wt 234 lb (106.1 kg)   SpO2 98%   BMI 31.74 kg/m?  ?Body mass index is 31.74 kg/m?Marland Kitchen ?Physical Exam: ?General Appearance:  Alert, cooperative, no distress, appears stated age  ?Head:  Normocephalic, without obvious abnormality, atraumatic  ?Eyes:  Conjunctiva clear, EOM's intact  ?Nose: Nares normal, hypertrophic turbinates, normal mucosa, and no visible anterior polyps  ?Throat: Lips, tongue normal; teeth and gums normal, normal posterior oropharynx  ?Neck: Supple, symmetrical  ?Lungs:   clear to auscultation bilaterally, Respirations unlabored, no coughing  ?Heart:  regular rate and rhythm and no murmur, Appears well perfused  ?Extremities: No edema  ?Skin: Skin color, texture, turgor normal, no rashes or lesions on visualized portions of skin  ?Neurologic: No gross deficits  ?Spirometry:  ?Tracings reviewed. His effort: Good reproducible efforts. ?FVC: 4.29L ?FEV1: 3.36L, 71% predicted ?FEV1/FVC ratio: 95% ?Interpretation: Spirometry consistent with possible restrictive disease.  ?Please see scanned spirometry results for details. ? ?Assessment/Plan  ? ?Mr. Knipple today presents for follow-up.  He his last visit was in 2021 and this is my first time meeting him.  It appears that his asthma is very well controlled, and is unclear if in the past his degree of asthma was missed categorize due to concomitant neurogenic cough which has now resolved following the use of gabapentin.  He has been off gabapentin and all controller inhalers for over a year.  He continues to have some intermittent asthma symptoms usually during illness or occasionally with exertion for which he uses albuterol very infrequently.  Additionally he continues to get sinus congestion and pain for which he will use  decongestants for prolonged.  And then may not reuse again for months.  Discussed other tactics for managing this sinus pressure pain to avoid rebound symptoms from prolonged decongestant use.  His previous allergy testing was mildly positive for molds and his symptoms are suspected to be mostly nonallergic in nature. ? ?Cough, persistent-resolved ? ?Moderate persistent Asthma-controlled ?- Rescue Inhaler: Albuterol (Proair/Ventolin) 2 puffs . Use  every 4-6 hours as needed for chest tightness, wheezing, or coughing.  Can also use 15 minutes prior to exercise if you have symptoms with activity. ?- Asthma is not controlled if: ? - Symptoms are occurring >2 times a week OR ? - >2 times a month nighttime awakenings ? - You are requiring systemic steroids (prednisone/steroid injections) more than once per year ? - Your require hospitalization for your asthma. ? - Please call  the clinic to schedule a follow up if these symptoms arise ? ?Perennial allergic rhinitis with predominantly nonallergic component ?Nasal saline spray (i.e., Simply Saline) or nasal saline lavage (i.e., NeilMed) is recommended as needed and prior to medicated nasal sprays. ?For sinus infections/congestion: use nasal saline lavage or saline spray followed by flonase 2 sprays 2 times daily for a few weeks at a time, can use decongestants no more than 3 days in a row to prevent rebound symptoms and no more than 1-2 times per month in this sequence ?For sinus pain, consider NSAIDs (ibuprofen, naproxen) as directed on bottle as needed ?For thick post nasal drainage, add guaifenesin 1200 mg (Mucinex Maximum Strength)  twice daily as needed with adequate hydration as discussed. ? ?Follow-up yearly, sooner if needed.  ? ?Tonny Bollman, MD  ?[facility] ? ? ? ? ? ? ?

## 2021-05-19 ENCOUNTER — Encounter: Payer: Self-pay | Admitting: Internal Medicine

## 2021-05-19 ENCOUNTER — Ambulatory Visit: Payer: BC Managed Care – PPO | Admitting: Internal Medicine

## 2021-05-19 VITALS — BP 124/82 | HR 102 | Temp 98.2°F | Resp 18 | Ht 72.0 in | Wt 234.0 lb

## 2021-05-19 DIAGNOSIS — J4540 Moderate persistent asthma, uncomplicated: Secondary | ICD-10-CM

## 2021-05-19 MED ORDER — ALBUTEROL SULFATE HFA 108 (90 BASE) MCG/ACT IN AERS: 2.0000 | INHALATION_SPRAY | RESPIRATORY_TRACT | 2 refills | Status: DC | PRN

## 2021-05-19 NOTE — Patient Instructions (Addendum)
Cough, persistent-resolved ? ?Moderate persistent Asthma-controlled ?- Rescue Inhaler: Albuterol (Proair/Ventolin) 2 puffs . Use  every 4-6 hours as needed for chest tightness, wheezing, or coughing.  Can also use 15 minutes prior to exercise if you have symptoms with activity. ?- Asthma is not controlled if: ? - Symptoms are occurring >2 times a week OR ? - >2 times a month nighttime awakenings ? - You are requiring systemic steroids (prednisone/steroid injections) more than once per year ? - Your require hospitalization for your asthma. ? - Please call the clinic to schedule a follow up if these symptoms arise ? ?Perennial allergic rhinitis with predominantly nonallergic component ?Nasal saline spray (i.e., Simply Saline) or nasal saline lavage (i.e., NeilMed) is recommended as needed and prior to medicated nasal sprays. ?For sinus infections/congestion: use nasal saline lavage or saline spray followed by flonase 2 sprays 2 times daily for a few weeks at a time, can use decongestants no more than 3 days in a row to prevent rebound symptoms and no more than 1-2 times per month in this sequence ?For sinus pain, consider NSAIDs (ibuprofen, naproxen) as directed on bottle as needed ?For thick post nasal drainage, add guaifenesin 1200 mg (Mucinex Maximum Strength)  twice daily as needed with adequate hydration as discussed. ? ?Follow-up yearly, sooner if needed.  ?

## 2021-05-20 DIAGNOSIS — I10 Essential (primary) hypertension: Secondary | ICD-10-CM | POA: Diagnosis not present

## 2021-05-20 DIAGNOSIS — F3341 Major depressive disorder, recurrent, in partial remission: Secondary | ICD-10-CM | POA: Diagnosis not present

## 2021-06-10 DIAGNOSIS — B349 Viral infection, unspecified: Secondary | ICD-10-CM | POA: Diagnosis not present

## 2021-06-18 DIAGNOSIS — J0100 Acute maxillary sinusitis, unspecified: Secondary | ICD-10-CM | POA: Diagnosis not present

## 2021-06-24 DIAGNOSIS — R051 Acute cough: Secondary | ICD-10-CM | POA: Diagnosis not present

## 2021-07-14 DIAGNOSIS — J40 Bronchitis, not specified as acute or chronic: Secondary | ICD-10-CM | POA: Diagnosis not present

## 2021-08-05 DIAGNOSIS — J019 Acute sinusitis, unspecified: Secondary | ICD-10-CM | POA: Diagnosis not present

## 2021-08-05 DIAGNOSIS — R053 Chronic cough: Secondary | ICD-10-CM | POA: Diagnosis not present

## 2021-08-23 DIAGNOSIS — J3489 Other specified disorders of nose and nasal sinuses: Secondary | ICD-10-CM | POA: Diagnosis not present

## 2021-08-23 DIAGNOSIS — J019 Acute sinusitis, unspecified: Secondary | ICD-10-CM | POA: Diagnosis not present

## 2021-08-23 DIAGNOSIS — R053 Chronic cough: Secondary | ICD-10-CM | POA: Diagnosis not present

## 2021-09-10 DIAGNOSIS — J322 Chronic ethmoidal sinusitis: Secondary | ICD-10-CM | POA: Diagnosis not present

## 2021-09-10 DIAGNOSIS — J320 Chronic maxillary sinusitis: Secondary | ICD-10-CM | POA: Diagnosis not present

## 2021-09-13 DIAGNOSIS — J322 Chronic ethmoidal sinusitis: Secondary | ICD-10-CM | POA: Diagnosis not present

## 2021-09-13 DIAGNOSIS — J320 Chronic maxillary sinusitis: Secondary | ICD-10-CM | POA: Diagnosis not present

## 2021-10-04 DIAGNOSIS — Z48813 Encounter for surgical aftercare following surgery on the respiratory system: Secondary | ICD-10-CM | POA: Diagnosis not present

## 2021-10-27 DIAGNOSIS — F411 Generalized anxiety disorder: Secondary | ICD-10-CM | POA: Diagnosis not present

## 2021-10-27 DIAGNOSIS — Z79899 Other long term (current) drug therapy: Secondary | ICD-10-CM | POA: Diagnosis not present

## 2021-10-27 DIAGNOSIS — F902 Attention-deficit hyperactivity disorder, combined type: Secondary | ICD-10-CM | POA: Diagnosis not present

## 2021-11-19 DIAGNOSIS — E782 Mixed hyperlipidemia: Secondary | ICD-10-CM | POA: Diagnosis not present

## 2021-11-19 DIAGNOSIS — Z0000 Encounter for general adult medical examination without abnormal findings: Secondary | ICD-10-CM | POA: Diagnosis not present

## 2021-11-19 DIAGNOSIS — Z23 Encounter for immunization: Secondary | ICD-10-CM | POA: Diagnosis not present

## 2021-11-19 DIAGNOSIS — I10 Essential (primary) hypertension: Secondary | ICD-10-CM | POA: Diagnosis not present

## 2021-11-23 DIAGNOSIS — E782 Mixed hyperlipidemia: Secondary | ICD-10-CM | POA: Diagnosis not present

## 2021-11-23 DIAGNOSIS — I10 Essential (primary) hypertension: Secondary | ICD-10-CM | POA: Diagnosis not present

## 2021-12-02 DIAGNOSIS — J320 Chronic maxillary sinusitis: Secondary | ICD-10-CM | POA: Diagnosis not present

## 2021-12-02 DIAGNOSIS — R053 Chronic cough: Secondary | ICD-10-CM | POA: Diagnosis not present

## 2021-12-08 DIAGNOSIS — F902 Attention-deficit hyperactivity disorder, combined type: Secondary | ICD-10-CM | POA: Diagnosis not present

## 2021-12-30 NOTE — Progress Notes (Signed)
FOLLOW UP Date of Service/Encounter:  12/31/21   Subjective:  Douglas Cervantes (DOB: [birth year]) is a 31 y.o. male who [facility] in re-evaluation of the following: asthma and allergic rhinitis  History obtained from: chart review and patient.  For Review, LV was on 05/19/2021 with Dr.Jalisha Enneking seen for routine follow-up.  His FEV1 was 71%.  He reported a recent upper respiratory illness and was having sinus congestion.  We discussed a proper nasal regimen for sinus congestion and pain.  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  Weaned off to 100 mg 3 times daily gabapentin in 2022 as well as Symbicort 160 and cough had been controlled.    Since his last visit, he has been following up with ENT and his cough had returned.  Reported control taking gabapentin 400 mg 3 times per day.  He was prescribed antibiotics in July for possible sinusitis using Levaquin and discussed sinus CT if symptoms do not improve.  Cough reported as more productive.  Sinus CT 08/23/21: Bilateral inferior maxillary sinus mucosal thickening with frothy secretions in the left maxillary sinus. Otherwise, clear sinuses  Following CT scan, he underwent bilateral maxillary antrostomy with fusion. His last visit with ENT was 12/02/2021.  Rhinoscopy showed granulated area and left ethmoid sinus.  He was again prescribed a 10-day course of Levaquin with plan to follow-up in 6 weeks.  2020 testing:  Epicutaneous testing: Negative despite a positive histamine control. Intradermal testing: Positive to molds. (Mold mix 1, 2, and 3)  Today presents for follow-up. He continues to have chronic cough.  He had done well for several years, but went on vacation in April and developed URI.  Following that URI cough returned.  He did restart gabapentin which helped some, but reports the quality of cough this time is different.  He was then seen by ENT and  based on CT sinus imaging, underwent sinus surgery.  Since sinus surgery, he did get some relief, but shortly after he continued to deal with increased drainage and ongoing cough.  He was recently evaluated by ENT and found to have granulation tissue which was removed and he was treated with antibiotics.  Cough did resolve while on antibiotics but then quickly returned.  He is following with ENT on a monthly basis at this point.  He does have an appointment with them on the 18th. He is associating some shortness of breath and wheezing with current coughing episodes.  He has used albuterol on a few occasions, and does feel that this is effective for cough.   Cough is typically worse upon wakening in the morning. He has not been given prednisone during this current bout of coughing. He does have reflux, but states that this controlled while taking famotidine daily. His previous allergy testing was positive to molds on intradermal's only.  He did have some work done under his house and was told there was some minimal mold found there.  Possible mold exposure.  Allergies as of 12/31/2021   No Known Allergies      Medication List        Accurate as of December 31, 2021  5:06 PM. If you have any questions, ask your nurse or doctor.          acetaminophen 500 MG tablet Commonly known as: TYLENOL Take 500 mg by mouth every 6 (six) hours as needed.   albuterol 108 (90 Base) MCG/ACT inhaler  Commonly known as: VENTOLIN HFA Inhale 2 puffs into the lungs every 4 (four) hours as needed for wheezing or shortness of breath. What changed: Another medication with the same name was removed. Continue taking this medication, and follow the directions you see here. Changed by: Verlee Monte, MD   budesonide-formoterol 80-4.5 MCG/ACT inhaler Commonly known as: Symbicort Inhale 2 puffs into the lungs in the morning and at bedtime. Started by: Verlee Monte, MD   buPROPion 150 MG 24 hr tablet Commonly  known as: WELLBUTRIN XL Take 150 mg by mouth every morning.   Carbinoxamine Maleate 4 MG Tabs Take 4 mg by mouth daily.   famotidine 20 MG tablet Commonly known as: PEPCID TAKE 1 TABLET BY MOUTH TWICE A DAY   gabapentin 400 MG capsule Commonly known as: NEURONTIN Take 400 mg by mouth 2 (two) times daily.   guaiFENesin 600 MG 12 hr tablet Commonly known as: MUCINEX Take 600 mg by mouth 2 (two) times daily as needed.   levofloxacin 500 MG tablet Commonly known as: LEVAQUIN Take 500 mg by mouth daily.   losartan 50 MG tablet Commonly known as: COZAAR Take 50 mg by mouth daily.   methylphenidate 30 MG CR capsule Commonly known as: METADATE CD Take 30 mg by mouth every morning.   omeprazole 40 MG capsule Commonly known as: PRILOSEC Take 1 capsule (40 mg total) by mouth daily before breakfast. What changed: when to take this Changed by: Verlee Monte, MD       Past Medical History:  Diagnosis Date   Allergy    Asthma    Recurrent upper respiratory infection (URI)    Past Surgical History:  Procedure Laterality Date   FRACTURE SURGERY     WISDOM TOOTH EXTRACTION     Otherwise, there have been no changes to his past medical history, surgical history, family history, or social history.  ROS: All others negative except as noted per HPI.   Objective:  BP 136/78 (BP Location: Right Arm, Patient Position: Sitting, Cuff Size: Large)   Pulse 99   Temp 97.9 F (36.6 C) (Temporal)   SpO2 98%  There is no height or weight on file to calculate BMI. Physical Exam: General Appearance:  Alert, cooperative, no distress, appears stated age  Head:  Normocephalic, without obvious abnormality, atraumatic  Eyes:  Conjunctiva clear, EOM's intact  Nose: Nares normal, hypertrophic turbinates, normal mucosa, and no visible anterior polyps  Throat: Lips, tongue normal; teeth and gums normal, normal posterior oropharynx  Neck: Supple, symmetrical  Lungs:   clear to auscultation  bilaterally, Respirations unlabored, intermittent dry coughing  Heart:  regular rate and rhythm and no murmur, Appears well perfused  Extremities: No edema  Skin: Skin color, texture, turgor normal, no rashes or lesions on visualized portions of skin  Neurologic: No gross deficits   Spirometry:  Tracings reviewed. His effort:  hard to interpret due to coughing FVC: 4.40L FEV1: 3.68L, 78% predicted FEV1/FVC ratio: 0.83 Interpretation:  nonobstructive ratio .  Please see scanned spirometry results for details.  Assessment/Plan  Cough, persistent- - start Symbicort 80 mcg, 2 puffs twice daily with spacer. Rinse mouth out after use. - start omeprazole 40 mg daily, take 30 minutes prior to breakfast Rest of plan as below  Moderate persistent Asthma - Rescue Inhaler: Albuterol (Proair/Ventolin) 2 puffs . Use  every 4-6 hours as needed for chest tightness, wheezing, or coughing.  Can also use 15 minutes prior to exercise if you have  symptoms with activity. - Asthma is not controlled if:  - Symptoms are occurring >2 times a week OR  - >2 times a month nighttime awakenings  - You are requiring systemic steroids (prednisone/steroid injections) more than once per year  - Your require hospitalization for your asthma.  - Please call the clinic to schedule a follow up if these symptoms arise  Perennial allergic rhinitis with predominantly nonallergic component Continue regimen as per Dr. Jenne Pane  Follow-up 6-8 weeks, sooner if needed. If no improvement at follow-up, will update allergy testing.  It was nice seeing you again.  Tonny Bollman, MD  [facility]

## 2021-12-31 ENCOUNTER — Encounter: Payer: Self-pay | Admitting: Internal Medicine

## 2021-12-31 ENCOUNTER — Other Ambulatory Visit: Payer: Self-pay

## 2021-12-31 ENCOUNTER — Ambulatory Visit: Payer: BC Managed Care – PPO | Admitting: Internal Medicine

## 2021-12-31 VITALS — BP 136/78 | HR 99 | Temp 97.9°F

## 2021-12-31 DIAGNOSIS — K219 Gastro-esophageal reflux disease without esophagitis: Secondary | ICD-10-CM | POA: Diagnosis not present

## 2021-12-31 DIAGNOSIS — J4540 Moderate persistent asthma, uncomplicated: Secondary | ICD-10-CM | POA: Diagnosis not present

## 2021-12-31 DIAGNOSIS — J3089 Other allergic rhinitis: Secondary | ICD-10-CM

## 2021-12-31 DIAGNOSIS — R053 Chronic cough: Secondary | ICD-10-CM

## 2021-12-31 MED ORDER — BUDESONIDE-FORMOTEROL FUMARATE 80-4.5 MCG/ACT IN AERO: 2.0000 | INHALATION_SPRAY | Freq: Two times a day (BID) | RESPIRATORY_TRACT | 12 refills | Status: DC

## 2021-12-31 MED ORDER — ALBUTEROL SULFATE HFA 108 (90 BASE) MCG/ACT IN AERS: 2.0000 | INHALATION_SPRAY | RESPIRATORY_TRACT | 2 refills | Status: DC | PRN

## 2021-12-31 MED ORDER — OMEPRAZOLE 40 MG PO CPDR: 40.0000 mg | DELAYED_RELEASE_CAPSULE | Freq: Every day | ORAL | 2 refills | Status: DC

## 2021-12-31 NOTE — Patient Instructions (Addendum)
Cough, persistent- - start Symbicort 80 mcg, 2 puffs twice daily with spacer Rinse mouth out after use. - start omeprazole 40 mg daily, take 30 minutes prior to breakfast Rest of plan as below   Moderate persistent Asthma - Rescue Inhaler: Albuterol (Proair/Ventolin) 2 puffs . Use  every 4-6 hours as needed for chest tightness, wheezing, or coughing.  Can also use 15 minutes prior to exercise if you have symptoms with activity. - Asthma is not controlled if:  - Symptoms are occurring >2 times a week OR  - >2 times a month nighttime awakenings  - You are requiring systemic steroids (prednisone/steroid injections) more than once per year  - Your require hospitalization for your asthma.  - Please call the clinic to schedule a follow up if these symptoms arise  Perennial allergic rhinitis with predominantly nonallergic component Continue regimen as per Dr. Jenne Pane  Follow-up 6-8 weeks, sooner if needed. If no improvement at follow-up, will update allergy testing.  It was nice seeing you again.

## 2022-01-10 DIAGNOSIS — R053 Chronic cough: Secondary | ICD-10-CM | POA: Diagnosis not present

## 2022-01-26 DIAGNOSIS — F411 Generalized anxiety disorder: Secondary | ICD-10-CM | POA: Diagnosis not present

## 2022-01-26 DIAGNOSIS — F902 Attention-deficit hyperactivity disorder, combined type: Secondary | ICD-10-CM | POA: Diagnosis not present

## 2022-01-26 DIAGNOSIS — Z79899 Other long term (current) drug therapy: Secondary | ICD-10-CM | POA: Diagnosis not present

## 2022-01-26 DIAGNOSIS — J3489 Other specified disorders of nose and nasal sinuses: Secondary | ICD-10-CM | POA: Diagnosis not present

## 2022-01-27 ENCOUNTER — Telehealth: Payer: Self-pay | Admitting: Internal Medicine

## 2022-01-27 MED ORDER — AEROCHAMBER PLUS FLO-VU MISC: 0 refills | Status: AC

## 2022-01-27 NOTE — Telephone Encounter (Signed)
Douglas Cervantes called in and states that he has lost his spacer and wants to know if another one can be called in for him?  He would like it called in to [facility] on [address].

## 2022-01-27 NOTE — Telephone Encounter (Signed)
Called patient and informed him that the spacer is being sent to the requested pharmacy. Patient stated that the spacer he had was from a previous [facility] patient that there may be a charge if insurance decides that it is too soon for another one. Patient stated he understood.

## 2022-02-01 ENCOUNTER — Other Ambulatory Visit: Payer: Self-pay | Admitting: *Deleted

## 2022-02-01 MED ORDER — OMEPRAZOLE 40 MG PO CPDR: 40.0000 mg | DELAYED_RELEASE_CAPSULE | Freq: Every day | ORAL | 1 refills | Status: DC

## 2022-02-01 MED ORDER — BUDESONIDE-FORMOTEROL FUMARATE 80-4.5 MCG/ACT IN AERO: 2.0000 | INHALATION_SPRAY | Freq: Two times a day (BID) | RESPIRATORY_TRACT | 1 refills | Status: DC

## 2022-02-03 ENCOUNTER — Telehealth: Payer: Self-pay

## 2022-02-03 NOTE — Telephone Encounter (Signed)
[facility] called stating Symbicort 80 will not be covered by insurance and they are requesting alternative. Please advice. Thank you   Call back # [phone]  Call ref number: [id number]

## 2022-02-03 NOTE — Telephone Encounter (Signed)
Can we find out what optiosn are available?

## 2022-02-04 MED ORDER — BUDESONIDE-FORMOTEROL FUMARATE 80-4.5 MCG/ACT IN AERO: 2.0000 | INHALATION_SPRAY | Freq: Two times a day (BID) | RESPIRATORY_TRACT | 1 refills | Status: DC

## 2022-02-04 NOTE — Addendum Note (Signed)
Addended by: Felipa Emory on: 02/04/2022 09:15 AM   Modules accepted: Orders

## 2022-02-04 NOTE — Telephone Encounter (Signed)
Pts formulary states they cover generic so I have sent in generic so waiting to see if we get a kick back

## 2022-02-10 NOTE — Progress Notes (Signed)
FOLLOW UP Date of Service/Encounter:  02/11/22   Subjective:  Douglas Cervantes (DOB: [birth year]) is a 32 y.o. male who returns to the [facility] on 02/11/2022 in re-evaluation of the following: chronic cough History obtained from: chart review and patient.  For Review, LV was on 12/31/21  with Dr.Promiss Labarbera seen for routine follow-up. Cough had returned and have been present for several months.  His spirometry was normal, but he was getting relief with albuterol.  We discussed starting Symbicort 80, 2 puffs twice a day as well as omeprazole 40 mg daily.  He was to continue follow-up with ENT. Taking gabapentin 400 mg 3 times daily.  Reports cough returns somewhat if he tries to self wean gabapentin.  Last evaluation by ENT 01/10/2022-cough reported as controlled, no changes to management.  Pertinent history/diagnostics:  2020 Epicutaneous testing: Negative despite a positive histamine control. 2020 Intradermal testing: Positive to molds. (Mold mix 1, 2, and 3) Sinus CT 08/23/21: Bilateral inferior maxillary sinus mucosal thickening with frothy secretions in the left maxillary sinus. Otherwise, clear sinuses  2023 sinus surgery: bilateral maxillary antrostomy with fusion 12/02/2021. Rhinoscopy showed granulated area and left ethmoid sinus.  Chronic cough: Previously prescribed gabapentin for neurogenic cough, weaned off in 2022 and symbicort Cough returned in April 2023 following URI.  Saw ENT in following abnormal CT sinus underwent sinus surgery.  Had some relief, however cough and drainage returned.  Associate some shortness of breath and wheezing.  Following with ENT monthly.  Albuterol does seem to be helping cough. Takes famotidine daily for reflux and states controlled. Has had possible mold exposure. Recurrent sinus infections:  Received Pneumovax 23 in 2020 at primary  Today presents for follow-up. Cough is a lot better and he is coughing up much less mucus.  Cough still  present, but is dry instead of mucuosy.  Still occurs daily. Occasionally associated with SOB. He is keeping his house humidified at 50% which seems to help a lot.  He is trying not to use the albuterol-maybe used only 2-3 times since last visit when having SOB.  Albuterol does help. He is constantly clearing his throat due to a small tickle in the past week given drop in humidity. Taking sudafed due to congestion in his nose which is helping. He has woken up over the past few days without the ability to breath through his nose. He is taking omeprazole, and is much more controlled.  No longer having breakthrough reflux. He is still taking gabapentin.  Experimented with skipping a dose, but cough returns.  Currently on 400 mg TID. He is not using nasal sprays because he dislikes them. He was sick over the holidays and this did seem to cause symptoms to rebound.  Overall he feels much better than prior with much room for improvement. He did require antibiotics twice since last visit for recurrent sinus infections. He does feel improvement with symptoms when on antibiotics.  He has had multiple courses of antibiotics in past year or 2 due to sinus infections despite having sinus surgery earlier this year.   Allergies as of 02/11/2022   No Known Allergies      Medication List        Accurate as of February 11, 2022  1:14 PM. If you have any questions, ask your nurse or doctor.          STOP taking these medications    budesonide-formoterol 80-4.5 MCG/ACT inhaler Commonly known as: Symbicort Replaced by: budesonide-formoterol 160-4.5  MCG/ACT inhaler Stopped by: Clemon Chambers, MD   Carbinoxamine Maleate 4 MG Tabs Stopped by: Clemon Chambers, MD   levofloxacin 500 MG tablet Commonly known as: LEVAQUIN Stopped by: Clemon Chambers, MD   methylphenidate 30 MG CR capsule Commonly known as: METADATE CD Stopped by: Clemon Chambers, MD       TAKE these medications    aerochamber plus with  mask inhaler Use as directed with inhaler.   albuterol 108 (90 Base) MCG/ACT inhaler Commonly known as: VENTOLIN HFA Inhale 2 puffs into the lungs every 4 (four) hours as needed for wheezing or shortness of breath.   budesonide-formoterol 160-4.5 MCG/ACT inhaler Commonly known as: Symbicort Inhale 2 puffs into the lungs in the morning and at bedtime. Replaces: budesonide-formoterol 80-4.5 MCG/ACT inhaler Started by: Clemon Chambers, MD   buPROPion 150 MG 24 hr tablet Commonly known as: WELLBUTRIN XL Take 150 mg by mouth every morning.   famotidine 20 MG tablet Commonly known as: PEPCID TAKE 1 TABLET BY MOUTH TWICE A DAY   gabapentin 400 MG capsule Commonly known as: NEURONTIN Take 400 mg by mouth 2 (two) times daily.   guaiFENesin 600 MG 12 hr tablet Commonly known as: MUCINEX Take 600 mg by mouth 2 (two) times daily as needed.   omeprazole 40 MG capsule Commonly known as: PRILOSEC Take 1 capsule (40 mg total) by mouth daily before breakfast.       Past Medical History:  Diagnosis Date   Allergy    Asthma    Recurrent upper respiratory infection (URI)    Past Surgical History:  Procedure Laterality Date   FRACTURE SURGERY     WISDOM TOOTH EXTRACTION     Otherwise, there have been no changes to his past medical history, surgical history, family history, or social history.  ROS: All others negative except as noted per HPI.   Objective:  BP 126/70   Pulse 62   Temp (!) 97.5 F (36.4 C) (Temporal)   Resp 17   Wt 218 lb (98.9 kg)   SpO2 98%   BMI 29.57 kg/m  Body mass index is 29.57 kg/m. Physical Exam: General Appearance:  Alert, cooperative, no distress, appears stated age, + throat clearing through encounter  Head:  Normocephalic, without obvious abnormality, atraumatic  Eyes:  Conjunctiva clear, EOM's intact  Nose: Nares normal, normal mucosa and no visible anterior polyps  Throat: Lips, tongue normal; teeth and gums normal, + cobblestoning, dry mucus  membranes  Neck: Supple, symmetrical  Lungs:   clear to auscultation bilaterally, Respirations unlabored, no coughing  Heart:  regular rate and rhythm and no murmur, Appears well perfused  Extremities: No edema  Skin: Skin color, texture, turgor normal, no rashes or lesions on visualized portions of skin  Neurologic: No gross deficits   Spirometry:  Tracings reviewed. His effort: Good reproducible efforts. FVC: 4.78L FEV1: 3.77L, 80% predicted FEV1/FVC ratio: 96% Interpretation: Spirometry consistent with normal pattern.  Please see scanned spirometry results for details.    Assessment/Plan   Cough, persistent-improved, uncontrolled - start Symbicort 160 mcg, 2 puffs twice daily with spacer Rinse mouth out after use. This will replace symbicort 80. - Continue omeprazole 40 mg daily, take 30 minutes prior to breakfast - screen of immune system due to recurrent sinus infections - Nasal saline use twice daily prior to nasacort - Start Nasacort (triamcinolone) 2 spays each nostril daily  Rest of plan as below Moderate persistent Asthma-imrproved, not controlled - Rescue Inhaler: Albuterol (  Proair/Ventolin) 2 puffs . Use  every 4-6 hours as needed for chest tightness, wheezing, or coughing.  Can also use 15 minutes prior to exercise if you have symptoms with activity. - Asthma is not controlled if:  - Symptoms are occurring >2 times a week OR  - >2 times a month nighttime awakenings  - You are requiring systemic steroids (prednisone/steroid injections) more than once per year  - Your require hospitalization for your asthma.  - Please call the clinic to schedule a follow up if these symptoms arise  Perennial allergic rhinitis with predominantly nonallergic component-not controlled  Continue regimen as per Dr. Jenne Pane, continue ENT follow-up.  Return January 26th at 1:30 PM for allergy testing (1-59) It was nice seeing you again.  Tonny Bollman, MD  [facility]

## 2022-02-11 ENCOUNTER — Encounter: Payer: Self-pay | Admitting: Internal Medicine

## 2022-02-11 ENCOUNTER — Ambulatory Visit: Payer: BC Managed Care – PPO | Admitting: Internal Medicine

## 2022-02-11 VITALS — BP 126/70 | HR 62 | Temp 97.5°F | Resp 17 | Wt 218.0 lb

## 2022-02-11 DIAGNOSIS — J329 Chronic sinusitis, unspecified: Secondary | ICD-10-CM | POA: Diagnosis not present

## 2022-02-11 DIAGNOSIS — R053 Chronic cough: Secondary | ICD-10-CM

## 2022-02-11 DIAGNOSIS — J4540 Moderate persistent asthma, uncomplicated: Secondary | ICD-10-CM | POA: Diagnosis not present

## 2022-02-11 DIAGNOSIS — K219 Gastro-esophageal reflux disease without esophagitis: Secondary | ICD-10-CM

## 2022-02-11 DIAGNOSIS — R0602 Shortness of breath: Secondary | ICD-10-CM | POA: Diagnosis not present

## 2022-02-11 DIAGNOSIS — J3089 Other allergic rhinitis: Secondary | ICD-10-CM

## 2022-02-11 MED ORDER — BUDESONIDE-FORMOTEROL FUMARATE 160-4.5 MCG/ACT IN AERO: 2.0000 | INHALATION_SPRAY | Freq: Two times a day (BID) | RESPIRATORY_TRACT | 1 refills | Status: DC

## 2022-02-11 NOTE — Patient Instructions (Addendum)
Cough, persistent- - start Symbicort 160 mcg, 2 puffs twice daily with spacer Rinse mouth out after use. This will replace symbicort 80. - Continue omeprazole 40 mg daily, take 30 minutes prior to breakfast - screen of immune system due to recurrent sinus infections - Nasal saline use twice daily prior to nasacort - Start Nasacort (triamcinolone) 2 spays each nostril daily  Rest of plan as below   Moderate persistent Asthma - Rescue Inhaler: Albuterol (Proair/Ventolin) 2 puffs . Use  every 4-6 hours as needed for chest tightness, wheezing, or coughing.  Can also use 15 minutes prior to exercise if you have symptoms with activity. - Asthma is not controlled if:  - Symptoms are occurring >2 times a week OR  - >2 times a month nighttime awakenings  - You are requiring systemic steroids (prednisone/steroid injections) more than once per year  - Your require hospitalization for your asthma.  - Please call the clinic to schedule a follow up if these symptoms arise  Perennial allergic rhinitis with predominantly nonallergic component Nasal saline use twice daily prior to nasacort Start Nasacort (triamcinolone) 2 spays each nostril daily Continue regimen as per Dr. Redmond Baseman  Return January 26th at 1:30 PM for allergy testing (1-59) It was nice seeing you again.

## 2022-02-16 LAB — STREP PNEUMONIAE 23 SEROTYPES IGG
Pneumo Ab Type 1*: 1.3 ug/mL — ABNORMAL LOW (ref >1.3)
Pneumo Ab Type 12 (12F)*: 0.3 ug/mL — ABNORMAL LOW (ref >1.3)
Pneumo Ab Type 14*: 0.5 ug/mL — ABNORMAL LOW (ref >1.3)
Pneumo Ab Type 17 (17F)*: 2.3 ug/mL (ref >1.3)
Pneumo Ab Type 19 (19F)*: 1.2 ug/mL — ABNORMAL LOW (ref >1.3)
Pneumo Ab Type 2*: 2 ug/mL (ref >1.3)
Pneumo Ab Type 20*: 1.2 ug/mL — ABNORMAL LOW (ref >1.3)
Pneumo Ab Type 22 (22F)*: 1.2 ug/mL — ABNORMAL LOW (ref >1.3)
Pneumo Ab Type 23 (23F)*: 0.5 ug/mL — ABNORMAL LOW (ref >1.3)
Pneumo Ab Type 26 (6B)*: 0.2 ug/mL — ABNORMAL LOW (ref >1.3)
Pneumo Ab Type 3*: 0.7 ug/mL — ABNORMAL LOW (ref >1.3)
Pneumo Ab Type 34 (10A)*: 0.5 ug/mL — ABNORMAL LOW (ref >1.3)
Pneumo Ab Type 4*: <0.1 ug/mL — ABNORMAL LOW (ref >1.3)
Pneumo Ab Type 43 (11A)*: 1.2 ug/mL — ABNORMAL LOW (ref >1.3)
Pneumo Ab Type 5*: 0.7 ug/mL — ABNORMAL LOW (ref >1.3)
Pneumo Ab Type 51 (7F)*: 2.1 ug/mL (ref >1.3)
Pneumo Ab Type 54 (15B)*: 0.2 ug/mL — ABNORMAL LOW (ref >1.3)
Pneumo Ab Type 56 (18C)*: 1.8 ug/mL (ref >1.3)
Pneumo Ab Type 57 (19A)*: 0.8 ug/mL — ABNORMAL LOW (ref >1.3)
Pneumo Ab Type 68 (9V)*: 1 ug/mL — ABNORMAL LOW (ref >1.3)
Pneumo Ab Type 70 (33F)*: 0.6 ug/mL — ABNORMAL LOW (ref >1.3)
Pneumo Ab Type 8*: 8.4 ug/mL (ref >1.3)
Pneumo Ab Type 9 (9N)*: 3.1 ug/mL (ref >1.3)

## 2022-02-16 LAB — IGG, IGA, IGM
IgA/Immunoglobulin A, Serum: 256 mg/dL (ref 90–386)
IgG (Immunoglobin G), Serum: 814 mg/dL (ref 603–1613)
IgM (Immunoglobulin M), Srm: 133 mg/dL (ref 20–172)

## 2022-02-16 LAB — CBC WITH DIFFERENTIAL
Basophils Absolute: 0.1 10*3/uL (ref 0.0–0.2)
Basos: 1 %
EOS (ABSOLUTE): 0.2 10*3/uL (ref 0.0–0.4)
Eos: 2 %
Hematocrit: 45.4 % (ref 37.5–51.0)
Hemoglobin: 14.8 g/dL (ref 13.0–17.7)
Immature Grans (Abs): 0.1 10*3/uL (ref 0.0–0.1)
Immature Granulocytes: 1 %
Lymphocytes Absolute: 2.3 10*3/uL (ref 0.7–3.1)
Lymphs: 22 %
MCH: 25.3 pg — ABNORMAL LOW (ref 26.6–33.0)
MCHC: 32.6 g/dL (ref 31.5–35.7)
MCV: 78 fL — ABNORMAL LOW (ref 79–97)
Monocytes Absolute: 0.9 10*3/uL (ref 0.1–0.9)
Monocytes: 8 %
Neutrophils Absolute: 7.4 10*3/uL — ABNORMAL HIGH (ref 1.4–7.0)
Neutrophils: 66 %
RBC: 5.84 x10E6/uL — ABNORMAL HIGH (ref 4.14–5.80)
RDW: 12.6 % (ref 11.6–15.4)
WBC: 10.9 10*3/uL — ABNORMAL HIGH (ref 3.4–10.8)

## 2022-02-16 LAB — DIPHTHERIA / TETANUS ANTIBODY PANEL
Diphtheria Ab: 0.88 IU/mL (ref <0.10)
Tetanus Ab, IgG: 1.77 IU/mL (ref <0.10)

## 2022-02-16 NOTE — Progress Notes (Signed)
Please let patient know that his antibody levels are okay, but he does not have adequate protection against strep pneumonia despite having had the vaccine in the past 5 years.  I will discuss our options at his upcoming visit in 2 days.  For now, we do not need to do anything different.

## 2022-02-17 NOTE — Progress Notes (Signed)
Date of Service/Encounter:  02/18/22  Allergy testing appointment   Most recent visit on 02/11/21, seen for moderate persistent asthma, chronic cough with upper airway component, recurrent sinusitis, reflux.  Please see that note for additional details.  Today reports for allergy diagnostic testing:    DIAGNOSTICS:  Skin Testing: Environmental allergy panel. Adequate positive and negative controls Results discussed with patient/family.  Airborne Adult Perc - 02/18/22 1343     Time Antigen Placed 1340    Allergen Manufacturer Lavella Hammock    Location Back    Number of Test 59    1. Control-Buffer 50% Glycerol Negative    2. Control-Histamine 1 mg/ml 3+    3. Albumin saline Negative    4. Godwin Negative    5. [place] Negative    6. Johnson Negative    7. Stanford Blue Negative    8. Meadow Fescue Negative    9. Perennial Rye Negative    10. Sweet Vernal Negative    11. Timothy Negative    12. Cocklebur Negative    13. Burweed Marshelder Negative    14. Ragweed, short Negative    15. Ragweed, Giant Negative    16. Plantain,  English Negative    17. Lamb's Quarters Negative    18. Sheep Sorrell Negative    19. Rough Pigweed Negative    20. Marsh Elder, Rough Negative    21. Mugwort, Common Negative    22. Ash mix Negative    23. Birch mix Negative    24. Beech American Negative    25. Box, Elder Negative    26. Cedar, red Negative    27. Cottonwood, [place] Negative    28. Elm mix Negative    29. Hickory Negative    30. Maple mix Negative    31. Oak, [place] mix Negative    32. Pecan Pollen Negative    33. Pine mix Negative    34. Sycamore Eastern Negative    35. Brent, Black Pollen Negative    36. Alternaria alternata Negative    37. Cladosporium Herbarum Negative    38. Aspergillus mix Negative    39. Penicillium mix Negative    40. Bipolaris sorokiniana (Helminthosporium) Negative    41. Drechslera spicifera (Curvularia) Negative    42. Mucor plumbeus Negative     43. Fusarium moniliforme Negative    44. Aureobasidium pullulans (pullulara) Negative    45. Rhizopus oryzae Negative    46. Botrytis cinera Negative    47. Epicoccum nigrum Negative    48. Phoma betae Negative    49. Candida Albicans Negative    50. Trichophyton mentagrophytes Negative    51. Mite, D Farinae  5,000 AU/ml Negative    52. Mite, D Pteronyssinus  5,000 AU/ml Negative    53. Cat Hair 10,000 BAU/ml Negative    54.  Dog Epithelia Negative    55. Mixed Feathers Negative    56. Horse Epithelia Negative    57. Cockroach, German Negative    58. Mouse Negative    59. Tobacco Leaf Negative             Intradermal - 02/18/22 1428     Time Antigen Placed 1425    Allergen Manufacturer Greer    Location Arm    Number of Test 15    Control Negative    Guatemala Negative    Johnson Negative    7 Grass Negative    Ragweed mix Negative    Weed mix Negative  Tree mix Negative    Mold 1 Negative    Mold 2 2+    Mold 3 Negative    Mold 4 2+    Cat Negative    Dog Negative    Cockroach Negative    Mite mix Negative             Allergy testing results were read and interpreted by myself, documented by clinical staff.  Patient provided with copy of allergy testing along with avoidance measures when indicated.   Sigurd Sos, MD  [facility]

## 2022-02-18 ENCOUNTER — Encounter: Payer: Self-pay | Admitting: Internal Medicine

## 2022-02-18 ENCOUNTER — Ambulatory Visit: Payer: BC Managed Care – PPO | Admitting: Internal Medicine

## 2022-02-18 DIAGNOSIS — J3089 Other allergic rhinitis: Secondary | ICD-10-CM | POA: Diagnosis not present

## 2022-02-18 NOTE — Patient Instructions (Signed)
Cough, persistent- - continue Symbicort 160 mcg, 2 puffs twice daily with spacer Rinse mouth out after use. This will replace symbicort 80. - Continue omeprazole 40 mg daily, take 30 minutes prior to breakfast - immune screen did show inadequate strep titers, otherwise normal (last Pneumovax-23 vaccine given 2020) - Nasal saline use twice daily prior to nasacort - Continue Nasacort (triamcinolone) 2 spays each nostril daily - allergy testing today was negative on skin testing, IDs borderline to mold mix 2 and 4 (indoor molds); allergen avoidance as below-would consider allergy injections as a last resort  Please see if PCP can give you Prevnar 20 vaccine and then let us know exact date given so we can plan for updated strep titers.    Rest of plan as below   Moderate persistent Asthma - Rescue Inhaler: Albuterol (Proair/Ventolin) 2 puffs . Use  every 4-6 hours as needed for chest tightness, wheezing, or coughing.  Can also use 15 minutes prior to exercise if you have symptoms with activity. - Asthma is not controlled if:  - Symptoms are occurring >2 times a week OR  - >2 times a month nighttime awakenings  - You are requiring systemic steroids (prednisone/steroid injections) more than once per year  - Your require hospitalization for your asthma.  - Please call the clinic to schedule a follow up if these symptoms arise  Perennial allergic rhinitis with predominantly nonallergic component Nasal saline use twice daily prior to nasacort Nasacort (triamcinolone) 2 spays each nostril daily Continue regimen as per Dr. Redmond Baseman  Return in 3 months, sooner if needed.  It was a pleasure seeing you again.  Thank you for letting me participate in your care.  Sigurd Sos, MD [facility]   Control of Mold Allergen   Mold and fungi can grow on a variety of surfaces provided certain temperature and moisture conditions exist.  Outdoor molds grow on plants, decaying vegetation and  soil.  The major outdoor mold, Alternaria and Cladosporium, are found in very high numbers during hot and dry conditions.  Generally, a late Summer - Fall peak is seen for common outdoor fungal spores.  Rain will temporarily lower outdoor mold spore count, but counts rise rapidly when the rainy period ends.  The most important indoor molds are Aspergillus and Penicillium.  Dark, humid and poorly ventilated basements are ideal sites for mold growth.  The next most common sites of mold growth are the bathroom and the kitchen.  Outdoor (Seasonal) Mold Control  Use air conditioning and keep windows closed Avoid exposure to decaying vegetation. Avoid leaf raking. Avoid grain handling. Consider wearing a face mask if working in moldy areas.    Indoor (Perennial) Mold Control   Maintain humidity below 50%. Clean washable surfaces with 5% bleach solution. Remove sources e.g. contaminated carpets.

## 2022-05-05 DIAGNOSIS — F902 Attention-deficit hyperactivity disorder, combined type: Secondary | ICD-10-CM | POA: Diagnosis not present

## 2022-05-05 DIAGNOSIS — Z79899 Other long term (current) drug therapy: Secondary | ICD-10-CM | POA: Diagnosis not present

## 2022-05-05 DIAGNOSIS — F411 Generalized anxiety disorder: Secondary | ICD-10-CM | POA: Diagnosis not present

## 2022-05-06 DIAGNOSIS — Z79891 Long term (current) use of opiate analgesic: Secondary | ICD-10-CM | POA: Diagnosis not present

## 2022-05-06 DIAGNOSIS — Z5181 Encounter for therapeutic drug level monitoring: Secondary | ICD-10-CM | POA: Diagnosis not present

## 2022-05-06 DIAGNOSIS — Z79899 Other long term (current) drug therapy: Secondary | ICD-10-CM | POA: Diagnosis not present

## 2022-05-20 DIAGNOSIS — I10 Essential (primary) hypertension: Secondary | ICD-10-CM | POA: Diagnosis not present

## 2022-05-21 NOTE — Progress Notes (Signed)
FOLLOW UP Date of Service/Encounter:  05/23/22   Subjective:  Douglas Cervantes (DOB: [birth year]) is a 32 y.o. male who [facility] in re-evaluation of the following: chronic cough, asthma History obtained from: chart review and patient.  For Review, LV was on 02/18/22  with Dr.Aymara Sassi seen for  allergy testing update . See below for summary of history and diagnostics.   Pertinent history/diagnostics:  2020 Epicutaneous testing: Negative despite a positive histamine control. 2020 Intradermal testing: Positive to molds. (Mold mix 1, 2, and 3) Sinus CT 08/23/21: Bilateral inferior maxillary sinus mucosal thickening with frothy secretions in the left maxillary sinus. Otherwise, clear sinuses  2023 sinus surgery: bilateral maxillary antrostomy with fusion 12/02/2021. Rhinoscopy showed granulated area and left ethmoid sinus.  Chronic cough: Previously prescribed gabapentin for neurogenic cough, weaned off in 2022 and symbicort Cough returned in April 2023 following URI.  Saw ENT in following abnormal CT sinus underwent sinus surgery.  Had some relief, however cough and drainage returned.  Associate some shortness of breath and wheezing.  Following with ENT monthly.  Albuterol does seem to be helping cough. Takes famotidine daily for reflux and states controlled. Restarted gabapentin by ENT.  Currently on 400 mg TID of gabapentin which controls cough. Has had possible mold exposure. 02/18/22: SPT environmental allergens  negative on skin testing, IDs borderline to mold mix 2 and 4 (indoor molds)  Recurrent sinus infections:  Received Pneumovax 23 in 2020 at primary Immune labs 01/1922: normal quants, adequate diphteria/strep, inadequate strep titers (6/23)  Today presents for follow-up. His cough is now controlled around 2-3 weeks after starting increased Symbicort 160 mcg 2 puffs BID. He has stopped taking gabapentin completely.  He was having a lot of  side effects from taking this medications. He is back to normal and happy with level of control. No longer having brain fog from gabapentin. He is having some nasal congestion. He has been using flonase which helps some but not completely. He is no longer taking sudafed. He has not tried azelastine spray.  He is still taking omeprazole. He hasn't tried stopping. He is afraid to stop because his reflux was previously not controlled and he does not want to go back to coughing.  He has not had any more sinus infections.  He has not been to ENT recently.   Of note, He is out of his blood pressure medicine for the past week so BP slighly elevated. Having trouble getting it sent from pharmacy.   Allergies as of 05/23/2022   No Known Allergies      Medication List        Accurate as of May 23, 2022  5:31 PM. If you have any questions, ask your nurse or doctor.          aerochamber plus with mask inhaler Use as directed with inhaler.   albuterol 108 (90 Base) MCG/ACT inhaler Commonly known as: VENTOLIN HFA Inhale 2 puffs into the lungs every 4 (four) hours as needed for wheezing or shortness of breath.   Azelastine-Fluticasone 137-50 MCG/ACT Susp Commonly known as: Dymista Place 1 spray into both nostrils 2 (two) times daily as needed. Started by: Verlee Monte, MD   budesonide-formoterol 160-4.5 MCG/ACT inhaler Commonly known as: Symbicort Inhale 2 puffs into the lungs in the morning and at bedtime.   buPROPion 150 MG 24 hr tablet Commonly known as: WELLBUTRIN XL Take 150 mg by mouth every morning.   famotidine  20 MG tablet Commonly known as: PEPCID TAKE 1 TABLET BY MOUTH TWICE A DAY   gabapentin 400 MG capsule Commonly known as: NEURONTIN Take 400 mg by mouth 2 (two) times daily.   guaiFENesin 600 MG 12 hr tablet Commonly known as: MUCINEX Take 600 mg by mouth 2 (two) times daily as needed.   omeprazole 40 MG capsule Commonly known as: PRILOSEC Take 1 capsule (40  mg total) by mouth daily before breakfast.       Past Medical History:  Diagnosis Date   Allergy    Asthma    Recurrent upper respiratory infection (URI)    Past Surgical History:  Procedure Laterality Date   FRACTURE SURGERY     WISDOM TOOTH EXTRACTION     Otherwise, there have been no changes to his past medical history, surgical history, family history, or social history.  ROS: All others negative except as noted per HPI.   Objective:  BP (!) 138/96 (BP Location: Left Arm, Patient Position: Sitting, Cuff Size: Normal)   Pulse (!) 107   Temp 98 F (36.7 C) (Temporal)   Resp (!) 22   SpO2 98%  There is no height or weight on file to calculate BMI. Physical Exam: General Appearance:  Alert, cooperative, no distress, appears stated age  Head:  Normocephalic, without obvious abnormality, atraumatic  Eyes:  Conjunctiva clear, EOM's intact  Nose: Nares normal, hypertrophic turbinates and normal mucosa  Throat: Lips, tongue normal; teeth and gums normal, normal posterior oropharynx  Neck: Supple, symmetrical  Lungs:   clear to auscultation bilaterally, Respirations unlabored, no coughing  Heart:  regular rate and rhythm and no murmur, Appears well perfused  Extremities: No edema  Skin: Skin color, texture, turgor normal and no rashes or lesions on visualized portions of skin  Neurologic: No gross deficits   Assessment/Plan   Cough, persistent-CONTROLLED!!!!  Spirometry due at follow-up. - continue Symbicort 160 mcg, 2 puffs twice daily with spacer Rinse mouth out after use.  - Continue omeprazole 40 mg daily, take 30 minutes prior to breakfast- can trial off - immune screen did show inadequate strep titers, otherwise normal (last Pneumovax-23 vaccine given 2020) - Nasal saline use twice daily prior to nasacort - Start Dymista 1 spay twice daily each nostril. This will replace Nasacort. - allergy testing was negative on skin testing, IDs borderline to mold mix 2 and 4  (indoor molds); allergen avoidance as below-would consider allergy injections as a last resort  Prevnar 20 vaccine and then let us know exact date given so we can plan for updated strep titers.    Rest of plan as below  Moderate persistent Asthma-controlled - Rescue Inhaler: Albuterol (Proair/Ventolin) 2 puffs . Use  every 4-6 hours as needed for chest tightness, wheezing, or coughing.  Can also use 15 minutes prior to exercise if you have symptoms with activity. - Asthma is not controlled if:  - Symptoms are occurring >2 times a week OR  - >2 times a month nighttime awakenings  - You are requiring systemic steroids (prednisone/steroid injections) more than once per year  - Your require hospitalization for your asthma.  - Please call the clinic to schedule a follow up if these symptoms arise  Perennial allergic rhinitis with predominantly nonallergic component; not controlled Nasal saline use twice daily prior to nasacort Dymisata 1 spays each nostril twice daily (this will replace nasacort) Continue regimen as per Dr. Jenne Pane  Return in 6 months, sooner if needed.  It was a  pleasure seeing you again.  Thank you for letting me participate in your care.  Tonny Bollman, MD  [facility] [place]

## 2022-05-23 ENCOUNTER — Ambulatory Visit: Payer: BC Managed Care – PPO | Admitting: Internal Medicine

## 2022-05-23 ENCOUNTER — Other Ambulatory Visit: Payer: Self-pay

## 2022-05-23 ENCOUNTER — Encounter: Payer: Self-pay | Admitting: Internal Medicine

## 2022-05-23 ENCOUNTER — Other Ambulatory Visit: Payer: Self-pay | Admitting: Internal Medicine

## 2022-05-23 VITALS — BP 138/96 | HR 107 | Temp 98.0°F | Resp 22

## 2022-05-23 DIAGNOSIS — J329 Chronic sinusitis, unspecified: Secondary | ICD-10-CM | POA: Diagnosis not present

## 2022-05-23 DIAGNOSIS — J3089 Other allergic rhinitis: Secondary | ICD-10-CM | POA: Diagnosis not present

## 2022-05-23 DIAGNOSIS — J4540 Moderate persistent asthma, uncomplicated: Secondary | ICD-10-CM

## 2022-05-23 MED ORDER — AZELASTINE-FLUTICASONE 137-50 MCG/ACT NA SUSP: 1.0000 | Freq: Two times a day (BID) | NASAL | 5 refills | Status: DC | PRN

## 2022-05-23 MED ORDER — PNEUMOCOCCAL 20-VAL CONJ VACC 0.5 ML IM SUSY: 0.5000 mL | PREFILLED_SYRINGE | INTRAMUSCULAR | 0 refills | Status: AC

## 2022-05-23 NOTE — Telephone Encounter (Signed)
Can we submit PA? He has already failed flonase alone. Thanks

## 2022-05-23 NOTE — Patient Instructions (Addendum)
Cough, persistent- - continue Symbicort 160 mcg, 2 puffs twice daily with spacer Rinse mouth out after use.  - Continue omeprazole 40 mg daily, take 30 minutes prior to breakfast- can trial off - immune screen did show inadequate strep titers, otherwise normal (last Pneumovax-23 vaccine given 2020) - Nasal saline use twice daily prior to nasacort - Start Dymista 1 spay twice daily each nostril. This will replace Nasacort. - allergy testing was negative on skin testing, IDs borderline to mold mix 2 and 4 (indoor molds); allergen avoidance as below-would consider allergy injections as a last resort  Prevnar 20 vaccine (ordered to [facility]) and then let us know exact date given so we can plan for updated strep titers.    Rest of plan as below  Moderate persistent Asthma - Rescue Inhaler: Albuterol (Proair/Ventolin) 2 puffs . Use  every 4-6 hours as needed for chest tightness, wheezing, or coughing.  Can also use 15 minutes prior to exercise if you have symptoms with activity. - Asthma is not controlled if:  - Symptoms are occurring >2 times a week OR  - >2 times a month nighttime awakenings  - You are requiring systemic steroids (prednisone/steroid injections) more than once per year  - Your require hospitalization for your asthma.  - Please call the clinic to schedule a follow up if these symptoms arise  Perennial allergic rhinitis with predominantly nonallergic component Nasal saline use twice daily prior to nasacort Dymisata 1 spays each nostril twice daily (this will replace nasacort) Continue regimen as per Dr. Jenne Pane  Return in 6 months, sooner if needed.  It was a pleasure seeing you again.  Thank you for letting me participate in your care.  Tonny Bollman, MD [facility]  Control of Mold Allergen   Mold and fungi can grow on a variety of surfaces provided certain temperature and moisture conditions exist.  Outdoor molds grow on plants, decaying vegetation and soil.   The major outdoor mold, Alternaria and Cladosporium, are found in very high numbers during hot and dry conditions.  Generally, a late Summer - Fall peak is seen for common outdoor fungal spores.  Rain will temporarily lower outdoor mold spore count, but counts rise rapidly when the rainy period ends.  The most important indoor molds are Aspergillus and Penicillium.  Dark, humid and poorly ventilated basements are ideal sites for mold growth.  The next most common sites of mold growth are the bathroom and the kitchen.  Outdoor (Seasonal) Mold Control  Use air conditioning and keep windows closed Avoid exposure to decaying vegetation. Avoid leaf raking. Avoid grain handling. Consider wearing a face mask if working in moldy areas.    Indoor (Perennial) Mold Control   Maintain humidity below 50%. Clean washable surfaces with 5% bleach solution. Remove sources e.g. contaminated carpets.

## 2022-05-24 MED ORDER — FLUTICASONE PROPIONATE 50 MCG/ACT NA SUSP: 2.0000 | Freq: Every day | NASAL | 5 refills | Status: DC

## 2022-05-25 ENCOUNTER — Telehealth: Payer: Self-pay | Admitting: Internal Medicine

## 2022-05-25 MED ORDER — AZELASTINE-FLUTICASONE 137-50 MCG/ACT NA SUSP: 2.0000 | Freq: Every day | NASAL | 5 refills | Status: DC

## 2022-05-25 NOTE — Telephone Encounter (Signed)
Resent in [facility] as his insurance does cover it

## 2022-05-25 NOTE — Telephone Encounter (Signed)
Pt states he is not able to azelastine, pharmacy is waiting on a call back.

## 2022-07-08 ENCOUNTER — Other Ambulatory Visit: Payer: Self-pay | Admitting: Internal Medicine

## 2022-07-25 DIAGNOSIS — G4709 Other insomnia: Secondary | ICD-10-CM | POA: Diagnosis not present

## 2022-07-25 DIAGNOSIS — F439 Reaction to severe stress, unspecified: Secondary | ICD-10-CM | POA: Diagnosis not present

## 2022-07-25 DIAGNOSIS — R42 Dizziness and giddiness: Secondary | ICD-10-CM | POA: Diagnosis not present

## 2022-09-07 DIAGNOSIS — F902 Attention-deficit hyperactivity disorder, combined type: Secondary | ICD-10-CM | POA: Diagnosis not present

## 2022-09-07 DIAGNOSIS — Z79899 Other long term (current) drug therapy: Secondary | ICD-10-CM | POA: Diagnosis not present

## 2022-09-07 DIAGNOSIS — F411 Generalized anxiety disorder: Secondary | ICD-10-CM | POA: Diagnosis not present

## 2022-09-08 DIAGNOSIS — F902 Attention-deficit hyperactivity disorder, combined type: Secondary | ICD-10-CM | POA: Diagnosis not present

## 2022-09-29 DIAGNOSIS — J320 Chronic maxillary sinusitis: Secondary | ICD-10-CM | POA: Diagnosis not present

## 2022-10-05 ENCOUNTER — Other Ambulatory Visit: Payer: Self-pay | Admitting: Internal Medicine

## 2022-10-14 ENCOUNTER — Encounter: Payer: Self-pay | Admitting: Internal Medicine

## 2022-10-14 ENCOUNTER — Ambulatory Visit: Payer: BC Managed Care – PPO | Admitting: Internal Medicine

## 2022-10-14 VITALS — BP 138/88 | HR 111 | Temp 98.1°F | Ht 70.5 in | Wt 226.0 lb

## 2022-10-14 DIAGNOSIS — K219 Gastro-esophageal reflux disease without esophagitis: Secondary | ICD-10-CM

## 2022-10-14 DIAGNOSIS — J4540 Moderate persistent asthma, uncomplicated: Secondary | ICD-10-CM

## 2022-10-14 DIAGNOSIS — J0181 Other acute recurrent sinusitis: Secondary | ICD-10-CM

## 2022-10-14 DIAGNOSIS — J3089 Other allergic rhinitis: Secondary | ICD-10-CM | POA: Diagnosis not present

## 2022-10-14 MED ORDER — AZELASTINE-FLUTICASONE 137-50 MCG/ACT NA SUSP: 2.0000 | Freq: Every day | NASAL | 5 refills | Status: DC

## 2022-10-14 MED ORDER — OMEPRAZOLE 40 MG PO CPDR: 40.0000 mg | DELAYED_RELEASE_CAPSULE | Freq: Every day | ORAL | 1 refills | Status: DC

## 2022-10-14 MED ORDER — BUDESONIDE-FORMOTEROL FUMARATE 160-4.5 MCG/ACT IN AERO: 2.0000 | INHALATION_SPRAY | Freq: Two times a day (BID) | RESPIRATORY_TRACT | 1 refills | Status: DC

## 2022-10-14 MED ORDER — METHYLPREDNISOLONE ACETATE 40 MG/ML IJ SUSP
40.0000 mg | Freq: Once | INTRAMUSCULAR | Status: AC
Start: 2022-10-14 — End: 2022-10-14
  Administered 2022-10-14: 40 mg via INTRAMUSCULAR

## 2022-10-14 MED ORDER — LEVOFLOXACIN 750 MG PO TABS: 750.0000 mg | ORAL_TABLET | Freq: Every day | ORAL | 0 refills | Status: AC

## 2022-10-14 MED ORDER — PREDNISONE 20 MG PO TABS: 40.0000 mg | ORAL_TABLET | Freq: Every day | ORAL | 0 refills | Status: AC

## 2022-10-14 NOTE — Progress Notes (Signed)
FOLLOW UP Date of Service/Encounter:  10/14/22  Subjective:  Douglas Cervantes (DOB: 1990-08-06) is a 32 y.o. male who [facility]22 in re-evaluation of the following: chronic cough, asthma  History obtained from: chart review and patient.  For Review, LV was on 05/23/22  with Dr.Koby Hartfield seen for routine follow-up. See below for summary of history and diagnostics.   Therapeutic plans/changes recommended: Cough was controlled finally, continued on Symbicort 162 puffs twice daily, omeprazole 40 mg daily, we added Dymista to be used 1 spray twice daily as needed ----------------------------------------------------- Pertinent History/Diagnostics:  2020 Epicutaneous testing: Negative despite a positive histamine control. 2020 Intradermal testing: Positive to molds. (Mold mix 1, 2, and 3) Sinus CT 08/23/21: Bilateral inferior maxillary sinus mucosal thickening with frothy secretions in the left maxillary sinus. Otherwise, clear sinuses  2023 sinus surgery: bilateral maxillary antrostomy with fusion 12/02/2021. Rhinoscopy showed granulated area and left ethmoid sinus.  Chronic cough: Previously prescribed gabapentin for neurogenic cough, weaned off in 2022 and symbicort Cough returned in April 2023 following URI.  Saw ENT in following abnormal CT sinus underwent sinus surgery.  Had some relief, however cough and drainage returned.  Associate some shortness of breath and wheezing.  Following with ENT monthly.  Albuterol does seem to be helping cough. Takes famotidine daily for reflux and states controlled. Restarted gabapentin by ENT.  Currently on 400 mg TID of gabapentin which controls cough. Has had possible mold exposure. 02/18/22: SPT environmental allergens  negative on skin testing, IDs borderline to mold mix 2 and 4 (indoor molds)  Recurrent sinus infections:  Received Pneumovax 23 in 2020 at primary Immune labs 01/1922: normal quants, adequate  diphteria/strep, inadequate strep titers (6/23) --------------------------------------------------- Discussed the use of AI scribe software for clinical note transcription with the patient, who gave verbal consent to proceed.  History of Present Illness   The patient, with a history of respiratory issues, presents with persistent sinus drainage and cough. The symptoms began following a COVID-19 infection approximately three to four weeks ago. After the COVID-19 infection cleared, the patient developed sinus issues, which is typical for him following a respiratory illness. The patient was prescribed Augmentin by their primary care provider for a suspected sinus infection. While the Augmentin helped with the discolored mucus and sinus pressure, the patient continued to have significant sinus drainage. The patient was then prescribed a Z-Pak, which did not seem to alleviate the symptoms. The patient reports that the sinus drainage is irritating his cough, which is productive. The cough is aggravated by normal or deep breathing. The patient also reports feeling nauseous due to swallowing the sinus drainage. The patient has a history of reflux, which is currently managed with omeprazole. The patient also has a history of respiratory issues, which have been managed with various treatments in the past, including prednisone and gabapentin. The patient is currently on Symbicort for his respiratory issues.  He is not currently on gabapentin. He has not followed up with ENT for recurrent symptoms. Feels his asthma has been controlled on Symbicort 2 puffs twice daily, though has not been able to participate in strenuous exercise such as running or heavy weightlifting.  Has not been using albuterol prior to exercise.  Does not like using albuterol as it makes him feel very jittery and gives him heart palpitations.  All medications reviewed by clinical staff and updated in chart. No new pertinent medical or surgical  history except as noted in HPI.  ROS:  All others negative except as noted per HPI.   Objective:  BP 138/88   Pulse (!) 111   Temp 98.1 F (36.7 C) (Temporal)   Ht 5' 10.5" (1.791 m)   Wt 226 lb (102.5 kg)   SpO2 99%   BMI 31.97 kg/m  Body mass index is 31.97 kg/m. Physical Exam: General Appearance:  Alert, cooperative, no distress, appears stated age, constant throat clearing throughout exam  Head:  Normocephalic, without obvious abnormality, atraumatic  Eyes:  Conjunctiva clear, EOM's intact  Ears EACs normal bilaterally and normal TMs bilaterally  Nose: Nares normal, hypertrophic turbinates, normal mucosa, and no visible anterior polyps  Throat: Lips, tongue normal; teeth and gums normal, normal posterior oropharynx and + cobblestoning  Neck: Supple, symmetrical  Lungs:   clear to auscultation bilaterally, Respirations unlabored, intermittent dry coughing  Heart:  regular rate and rhythm and no murmur, Appears well perfused  Extremities: No edema  Skin: Skin color, texture, turgor normal and no rashes or lesions on visualized portions of skin  Neurologic: No gross deficits   Labs:  Lab Orders  No laboratory test(s) ordered today    Spirometry:  Tracings reviewed. His effort: It was hard to get consistent efforts and there is a question as to whether this reflects a maximal maneuver.  Patient only able to give 1 exhalation due to feeling poorly  FVC: 4.48 L FEV1: 3.58 L, 79% predicted FEV1/FVC ratio: 98% Interpretation: Spirometry consistent with normal pattern.  Please see scanned spirometry results for details.  Assessment/Plan   Assessment and Plan    Post-viral Sinusitis Persistent sinus drainage and cough despite two rounds of antibiotics (Augmentin and Z-Pak). No improvement with cough suppressants. -Administer Decadron 40 mg injection today and prescribe a 4-day course of oral steroids- prednisone 40 mg oral starting tomorrow x 4 days. -Consider Levaquin if  no improvement by Monday, with instructions to maintain gut health with probiotics and live cultured yogurt. -Start Atrovent 1-2 sprays up to twice daily nasal spray to reduce mucus production. -Continue saline rinses to clean out sinuses. - continue dymista daily -Consider ENT referral if no improvement.  Asthma Controlled with Symbicort prior to recent illness. Increased use of rescue inhaler due to current symptoms. -Continue Symbicort 160 mcg twice daily, two puffs. -Transition to Xopenex (Levalbuterol) as rescue inhaler due to patient's reported palpitations with Albuterol.  Gastroesophageal Reflux Disease (GERD) Controlled with omeprazole. Increased symptoms likely due to current sinus drainage. -Increase omeprazole 40 mg to twice daily for the next few weeks.  Follow-up Monitor response to treatment. If no improvement, consider ENT referral.        Other: none  Tonny Bollman, MD  [facility] [place]

## 2022-10-14 NOTE — Patient Instructions (Addendum)
Cough, persistent- following recent sinus infection due to covid - 40 mg depomedrol in clinic - tomorrow start: 40 mg prednisone daily x 4 days - levaquin 750 mg daily for 5 days (start if not feeling improved on steroids after 2-3 days)  - continue Symbicort 160 mcg, 2 puffs twice daily with spacer Rinse mouth out after use.  - Continue omeprazole 40 mg daily, take 30 minutes prior to breakfast- increase to twice daily for the next few weeks. - immune screen did show inadequate strep titers, otherwise normal (last Pneumovax-23 vaccine given 2020) - Nasal saline rinses use twice daily prior to medicated nasal sprays - continue Dymista 1 spay twice daily each nostril.  - add Atrovent nasal spray 1-2 sprays up to twice daily to help with drainage. - allergy testing was negative on skin testing, IDs borderline to mold mix 2 and 4 (indoor molds); allergen avoidance as below-would consider allergy injections as a last resort  If no improvement, schedule follow-up with ENT.  Moderate persistent Asthma - Rescue Inhaler: LevAlbuterol (Xopenex) 2 puffs . Use  every 4-6 hours as needed for chest tightness, wheezing, or coughing.  Can also use 15 minutes prior to exercise if you have symptoms with activity. - Asthma is not controlled if:  - Symptoms are occurring >2 times a week OR  - >2 times a month nighttime awakenings  - You are requiring systemic steroids (prednisone/steroid injections) more than once per year  - Your require hospitalization for your asthma.  - Please call the clinic to schedule a follow up if these symptoms arise  Perennial allergic rhinitis with predominantly nonallergic component Nasal saline use twice daily prior to nasacort Dymisata 1 spays each nostril twice daily (this will replace nasacort) Continue regimen as per Dr. Jenne Pane  Return in 6 months, sooner if needed.  It was a pleasure seeing you again.  Thank you for letting me participate in your care.  Tonny Bollman,  MD [facility] [place]  Control of Mold Allergen   Mold and fungi can grow on a variety of surfaces provided certain temperature and moisture conditions exist.  Outdoor molds grow on plants, decaying vegetation and soil.  The major outdoor mold, Alternaria and Cladosporium, are found in very high numbers during hot and dry conditions.  Generally, a late Summer - Fall peak is seen for common outdoor fungal spores.  Rain will temporarily lower outdoor mold spore count, but counts rise rapidly when the rainy period ends.  The most important indoor molds are Aspergillus and Penicillium.  Dark, humid and poorly ventilated basements are ideal sites for mold growth.  The next most common sites of mold growth are the bathroom and the kitchen.  Outdoor (Seasonal) Mold Control  Use air conditioning and keep windows closed Avoid exposure to decaying vegetation. Avoid leaf raking. Avoid grain handling. Consider wearing a face mask if working in moldy areas.    Indoor (Perennial) Mold Control   Maintain humidity below 50%. Clean washable surfaces with 5% bleach solution. Remove sources e.g. contaminated carpets.

## 2022-10-17 ENCOUNTER — Telehealth: Payer: Self-pay

## 2022-10-17 ENCOUNTER — Other Ambulatory Visit (HOSPITAL_COMMUNITY): Payer: Self-pay

## 2022-10-17 NOTE — Telephone Encounter (Signed)
*  Asthma/Allergy  Pharmacy Patient Advocate Encounter  Received notification from [facility] that Prior Authorization for Azelastine-Fluticasone 137-50MCG/ACT suspension  has been APPROVED from 10/17/2022 to 10/16/2023. Ran test claim, Copay is $10.00. This test claim was processed through [facility]- copay amounts may vary at other pharmacies due to pharmacy/plan contracts, or as the patient moves through the different stages of their insurance plan.   PA #/Case ID/Reference #: [id number]

## 2022-10-18 ENCOUNTER — Other Ambulatory Visit: Payer: Self-pay | Admitting: Internal Medicine

## 2022-10-18 ENCOUNTER — Other Ambulatory Visit: Payer: Self-pay

## 2022-10-18 ENCOUNTER — Encounter: Payer: Self-pay | Admitting: Internal Medicine

## 2022-10-18 MED ORDER — LEVALBUTEROL TARTRATE 45 MCG/ACT IN AERO: 2.0000 | INHALATION_SPRAY | RESPIRATORY_TRACT | 1 refills | Status: DC | PRN

## 2022-10-19 ENCOUNTER — Telehealth: Payer: Self-pay

## 2022-10-19 ENCOUNTER — Other Ambulatory Visit (HOSPITAL_COMMUNITY): Payer: Self-pay

## 2022-10-19 NOTE — Telephone Encounter (Signed)
Left [facility] a message on their voice mail to fill the xopenex hfa (levalbuterol) bc patient can't take albuterol hfa which causes him to have palpations. [facility] not on his formulary so pharmacist ran xopenex hfa through [facility]. Cost was 45.89. patient picked it up yesterday.

## 2022-10-19 NOTE — Telephone Encounter (Signed)
Thanks-can you check on xopenex inhaler? It looks like it was discontinued by Hilda Lias? Also, I agree with him seeing ENT. If his cough does not improve after levaquin, would consider restarting gabapentin-but this I believe was being prescribed by ENT.

## 2022-10-19 NOTE — Telephone Encounter (Signed)
Pa for xopenex 45 pt gets jitters and palpatations with albuterol inhalers

## 2022-10-19 NOTE — Telephone Encounter (Signed)
PA request has been Submitted. New Encounter created for follow up. For additional info see Pharmacy Prior Auth telephone encounter from 09/25.

## 2022-10-19 NOTE — Telephone Encounter (Signed)
*  Asthma/Allergy  Pharmacy Patient Advocate Encounter   Received notification from Patient Advice Request messages that prior authorization for Levalbuterol Tartrate 45MCG/ACT aerosol  is required/requested.   Insurance verification completed.   The patient is insured through [employer] .   Per test claim: PA required; PA submitted to [employer] via CoverMyMeds Key/confirmation #/EOC [id number] Status is pending

## 2022-10-20 ENCOUNTER — Other Ambulatory Visit (HOSPITAL_COMMUNITY): Payer: Self-pay

## 2022-10-20 NOTE — Telephone Encounter (Signed)
Pharmacy Patient Advocate Encounter  Received notification from [employer] that Prior Authorization for Levalbuterol Tartrate 45MCG/ACT aerosol  has been APPROVED from 10/19/2022 to 10/18/2023. Ran test claim, Copay is $50.00. This test claim was processed through [facility]- copay amounts may vary at other pharmacies due to pharmacy/plan contracts, or as the patient moves through the different stages of their insurance plan.

## 2022-11-11 DIAGNOSIS — J320 Chronic maxillary sinusitis: Secondary | ICD-10-CM | POA: Diagnosis not present

## 2022-11-11 DIAGNOSIS — R053 Chronic cough: Secondary | ICD-10-CM | POA: Diagnosis not present

## 2022-11-24 ENCOUNTER — Ambulatory Visit: Payer: BC Managed Care – PPO | Admitting: Internal Medicine

## 2022-12-01 DIAGNOSIS — Z0000 Encounter for general adult medical examination without abnormal findings: Secondary | ICD-10-CM | POA: Diagnosis not present

## 2022-12-01 DIAGNOSIS — K219 Gastro-esophageal reflux disease without esophagitis: Secondary | ICD-10-CM | POA: Diagnosis not present

## 2022-12-01 DIAGNOSIS — F3341 Major depressive disorder, recurrent, in partial remission: Secondary | ICD-10-CM | POA: Diagnosis not present

## 2022-12-01 DIAGNOSIS — J3089 Other allergic rhinitis: Secondary | ICD-10-CM | POA: Diagnosis not present

## 2022-12-01 DIAGNOSIS — R053 Chronic cough: Secondary | ICD-10-CM | POA: Diagnosis not present

## 2022-12-01 DIAGNOSIS — I10 Essential (primary) hypertension: Secondary | ICD-10-CM | POA: Diagnosis not present

## 2022-12-01 DIAGNOSIS — E782 Mixed hyperlipidemia: Secondary | ICD-10-CM | POA: Diagnosis not present

## 2022-12-14 ENCOUNTER — Telehealth: Payer: Self-pay | Admitting: Internal Medicine

## 2022-12-14 MED ORDER — OMEPRAZOLE 40 MG PO CPDR: 40.0000 mg | DELAYED_RELEASE_CAPSULE | Freq: Two times a day (BID) | ORAL | 1 refills | Status: DC

## 2022-12-14 NOTE — Telephone Encounter (Signed)
Sent in omeprazole for bid according to AVS with quantity of 60

## 2022-12-14 NOTE — Telephone Encounter (Signed)
Patient called stating omeprazole (PRILOSEC) 40 MG capsule [[id number]] was increased to twice a day instead of once  a day pharmacy will not refill needs the RX resent to reflect change in dosage

## 2022-12-19 DIAGNOSIS — F411 Generalized anxiety disorder: Secondary | ICD-10-CM | POA: Diagnosis not present

## 2022-12-19 DIAGNOSIS — F902 Attention-deficit hyperactivity disorder, combined type: Secondary | ICD-10-CM | POA: Diagnosis not present

## 2022-12-19 DIAGNOSIS — Z79899 Other long term (current) drug therapy: Secondary | ICD-10-CM | POA: Diagnosis not present

## 2023-01-14 ENCOUNTER — Other Ambulatory Visit: Payer: Self-pay | Admitting: Internal Medicine

## 2023-03-21 DIAGNOSIS — F902 Attention-deficit hyperactivity disorder, combined type: Secondary | ICD-10-CM | POA: Diagnosis not present

## 2023-03-21 DIAGNOSIS — Z79899 Other long term (current) drug therapy: Secondary | ICD-10-CM | POA: Diagnosis not present

## 2023-03-21 DIAGNOSIS — F411 Generalized anxiety disorder: Secondary | ICD-10-CM | POA: Diagnosis not present

## 2023-03-27 DIAGNOSIS — Z79899 Other long term (current) drug therapy: Secondary | ICD-10-CM | POA: Diagnosis not present

## 2023-03-27 DIAGNOSIS — F902 Attention-deficit hyperactivity disorder, combined type: Secondary | ICD-10-CM | POA: Diagnosis not present

## 2023-03-27 DIAGNOSIS — M7660 Achilles tendinitis, unspecified leg: Secondary | ICD-10-CM | POA: Diagnosis not present

## 2023-04-14 ENCOUNTER — Ambulatory Visit: Payer: BC Managed Care – PPO | Admitting: Internal Medicine

## 2023-04-20 ENCOUNTER — Ambulatory Visit: Payer: BC Managed Care – PPO | Admitting: Internal Medicine

## 2023-04-20 ENCOUNTER — Encounter: Payer: Self-pay | Admitting: Internal Medicine

## 2023-04-20 VITALS — BP 116/76 | HR 102 | Temp 97.9°F | Resp 17

## 2023-04-20 DIAGNOSIS — J3089 Other allergic rhinitis: Secondary | ICD-10-CM

## 2023-04-20 DIAGNOSIS — K219 Gastro-esophageal reflux disease without esophagitis: Secondary | ICD-10-CM | POA: Diagnosis not present

## 2023-04-20 DIAGNOSIS — R053 Chronic cough: Secondary | ICD-10-CM

## 2023-04-20 DIAGNOSIS — J4540 Moderate persistent asthma, uncomplicated: Secondary | ICD-10-CM

## 2023-04-20 MED ORDER — BUDESONIDE-FORMOTEROL FUMARATE 160-4.5 MCG/ACT IN AERO
2.0000 | INHALATION_SPRAY | Freq: Two times a day (BID) | RESPIRATORY_TRACT | 1 refills | Status: AC
Start: 2023-04-20 — End: ?

## 2023-04-20 MED ORDER — OMEPRAZOLE 40 MG PO CPDR: 40.0000 mg | DELAYED_RELEASE_CAPSULE | Freq: Two times a day (BID) | ORAL | 1 refills | Status: AC

## 2023-04-20 MED ORDER — IPRATROPIUM BROMIDE 0.06 % NA SOLN: 2.0000 | Freq: Three times a day (TID) | NASAL | 5 refills | Status: AC | PRN

## 2023-04-20 MED ORDER — AZELASTINE-FLUTICASONE 137-50 MCG/ACT NA SUSP: 2.0000 | Freq: Every day | NASAL | 5 refills | Status: DC

## 2023-04-20 NOTE — Progress Notes (Signed)
 FOLLOW UP Date of Service/Encounter:  04/20/23  Subjective:  Douglas Cervantes (DOB: June 30, 1990) is a 33 y.o. male who [facility] in re-evaluation of the following: Chronic cough, moderate persistent asthma, recurrent sinus infection, reflux History obtained from: chart review and patient.  For Review, LV was on 10/14/22  with Dr.Lacye Mccarn seen for routine follow-up. See below for summary of history and diagnostics.   Therapeutic plans/changes recommended: He was diagnosed with postviral sinusitis and treated with prednisone. ----------------------------------------------------- Pertinent History/Diagnostics:  2020 Epicutaneous testing: Negative despite a positive histamine control. 2020 Intradermal testing: Positive to molds. (Mold mix 1, 2, and 3) Sinus CT 08/23/21: Bilateral inferior maxillary sinus mucosal thickening with frothy secretions in the left maxillary sinus. Otherwise, clear sinuses  2023 sinus surgery: bilateral maxillary antrostomy with fusion 12/02/2021. Rhinoscopy showed granulated area and left ethmoid sinus.  Chronic cough: Previously prescribed gabapentin for neurogenic cough, weaned off in 2022 and symbicort Cough returned in April 2023 following URI.  Saw ENT in following abnormal CT sinus underwent sinus surgery.  Had some relief, however cough and drainage returned.  Associate some shortness of breath and wheezing.  Following with ENT monthly.  Albuterol does seem to be helping cough. Takes famotidine daily for reflux and states controlled. Restarted gabapentin by ENT.  Currently on 400 mg TID of gabapentin which controls cough. Has had possible mold exposure. 02/18/22: SPT environmental allergens  negative on skin testing, IDs borderline to mold mix 2 and 4 (indoor molds)  Recurrent sinus infections:  Received Pneumovax 23 in 2020 at primary Immune labs 01/1922: normal quants, adequate diphteria/strep, inadequate strep titers  (6/23) --------------------------------------------------- Today presents for follow-up. Discussed the use of AI scribe software for clinical note transcription with the patient, who gave verbal consent to proceed.  History of Present Illness   Douglas Cervantes is a 33 year old male with asthma who presents with a persistent cough and respiratory issues.  He has been experiencing a persistent cough and respiratory issues that resurfaced after contracting the flu about a month ago. Previously, his symptoms were well-controlled, allowing him to reduce his medication. However, the resurgence of symptoms has necessitated an increase in his medication regimen.  He experiences significant postnasal drip and thick mucus that is difficult to clear, particularly when he feels dry. This has been a persistent issue, contributing to his respiratory discomfort.  His current medication regimen includes 200 mg of gabapentin in the morning and before bed, one tablet of amitriptyline before bed, and the use of Symbicort and Dymista nasal spray in the morning. He attempted to use Symbicort at night but found it disrupted his sleep due to increased coughing from inhaler. He no longer has Atrovent nasal spray, which was previously prescribed for drainage. He uses Xopenex infrequently.  He continues to take omeprazole twice a day for heartburn, which he finds necessary to prevent symptoms. He previously tried Levaquin, an antibiotic, but did not notice any improvement in his symptoms.  He wants to reduce his gabapentin dosage due to side effects at higher doses, describing feeling 'like a zombie'. He has been able to manage his medication regimen effectively, titrating doses as needed.      Chart Review: 11/11/22  All medications reviewed by clinical staff and updated in chart. No new pertinent medical or surgical history except as noted in HPI.  ROS: All others negative except as noted per HPI.   Objective:  BP  116/76   Pulse Marland Kitchen)  102   Temp 97.9 F (36.6 C) (Temporal)   Resp 17   SpO2 99%  There is no height or weight on file to calculate BMI. Physical Exam: General Appearance:  Alert, cooperative, no distress, appears stated age  Head:  Normocephalic, without obvious abnormality, atraumatic  Eyes:  Conjunctiva clear, EOM's intact  Ears EACs normal bilaterally and normal TMs bilaterally  Nose: Nares normal, hypertrophic turbinates, normal mucosa, no visible anterior polyps, and septum midline  Throat: Lips, tongue normal; teeth and gums normal, normal posterior oropharynx  Neck: Supple, symmetrical  Lungs:   clear to auscultation bilaterally, Respirations unlabored, no coughing  Heart:  regular rate and rhythm and no murmur, Appears well perfused  Extremities: No edema  Skin: Skin color, texture, turgor normal and no rashes or lesions on visualized portions of skin  Neurologic: No gross deficits   Labs:  Lab Orders         CBC with Differential/Platelet         IgE      Spirometry:  Tracings reviewed. His effort: Good reproducible efforts. FVC: 4.24L FEV1: 3.53L, 78% predicted FEV1/FVC ratio: 0.83 Interpretation: Nonobstructive ratio, low FEV1, possible restriction.  Please see scanned spirometry results for details.   Assessment/Plan   Cough, persistent- multifactorial (GERD, asthma, rhinitis and neuorgenic components) Cough exacerbated post-influenza, associated with thick mucus and postnasal drip. Atrovent nasal spray discussed for management with caution about dryness. - continue Symbicort 160 mcg, 2 puffs twice daily with spacer Rinse mouth out after use.  - Continue omeprazole 40 mg TWICE daily, take 30 minutes prior to meals - immune screen did show inadequate strep titers, otherwise normal (last Pneumovax-23 vaccine given 2020) - Nasal saline rinses use twice daily prior to medicated nasal sprays - continue Dymista 1 spay twice daily each nostril.  - as needed: Atrovent  nasal spray 1-2 sprays up to twice daily as needed to help with drainage. 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 FEV1 at 78%. Harrington Challenger considered due to elevated eosinophil levels, preferred over Nucala for dosing schedule. Daily controller inhaler: Symbicort 160 mcg 2 puffs BID. Rinse mouth after use. - Rescue Inhaler: LevAlbuterol (Xopenex) 2 puffs . Use  every 4-6 hours as needed for chest tightness, wheezing, or coughing.  Can also use 15 minutes prior to exercise if you have symptoms with activity. - Asthma is not controlled if:  - Symptoms are occurring >2 times a week OR  - >2 times a month nighttime awakenings  - You are requiring systemic steroids (prednisone/steroid injections) more than once per year  - Your require hospitalization for your asthma.  - Please call the clinic to schedule a follow up if these symptoms arise AEC 02/11/22-200; will repeat today to see if eligible for an anti-IL5 such as Fasenra (every 4 weeks x 3, then every 8 weeks) or Nucala (every 4 weeks)-will submit for fasenra first.  Perennial allergic rhinitis with predominantly nonallergic component-not at goal Nasal saline use twice daily prior to nasacort Dymisata 1 spays each nostril twice daily  Continue regimen as per Dr. Jenne Pane  Return in 6 months, sooner if needed.  We will submit paperwork for Fasenra. You will hear from our biologics coordinator Tammy  VonCannon. Please answer her phone calls to ensure a seamless approval process.  It was a pleasure seeing you again.  Thank you for letting me participate in your care.  Tonny Bollman, MD [facility] [place]  Other: labs today for biologics candidacy  Tonny Bollman, MD  [facility]

## 2023-04-20 NOTE — Patient Instructions (Addendum)
 Cough, persistent-  - continue Symbicort 160 mcg, 2 puffs twice daily with spacer Rinse mouth out after use.  - Continue omeprazole 40 mg TWICE daily, take 30 minutes prior to meals - immune screen did show inadequate strep titers, otherwise normal (last Pneumovax-23 vaccine given 2020) - Nasal saline rinses use twice daily prior to medicated nasal sprays - continue Dymista 1 spay twice daily each nostril.  - as needed: Atrovent nasal spray 1-2 sprays up to twice daily as needed to help with drainage. - allergy testing was negative on skin testing, IDs borderline to mold mix 2 and 4 (indoor molds); allergen avoidance as below-would consider allergy injections as a last resort -continue gabapentin and amytriptyline as prescribed by ENT -continue regular follow-up with ENT  Moderate persistent Asthma/eosinophilic Daily controller inhaler: Symbicort 160 mcg 2 puffs BID. Rinse mouth after use. - Rescue Inhaler: LevAlbuterol (Xopenex) 2 puffs . Use  every 4-6 hours as needed for chest tightness, wheezing, or coughing.  Can also use 15 minutes prior to exercise if you have symptoms with activity. - Asthma is not controlled if:  - Symptoms are occurring >2 times a week OR  - >2 times a month nighttime awakenings  - You are requiring systemic steroids (prednisone/steroid injections) more than once per year  - Your require hospitalization for your asthma.  - Please call the clinic to schedule a follow up if these symptoms arise AEC 02/11/22-200; will repeat today to see if eligible for an anti-IL5 such as Fasenra (every 4 weeks x 3, then every 8 weeks) or Nucala (every 4 weeks)-will submit for fasenra first.  Perennial allergic rhinitis with predominantly nonallergic component Nasal saline use twice daily prior to nasacort Dymisata 1 spays each nostril twice daily (this will replace nasacort) Continue regimen as per Dr. Jenne Pane  Return in 6 months, sooner if needed.  We will submit paperwork for  Fasenra. You will hear from our biologics coordinator Tammy VonCannon. Please answer her phone calls to ensure a seamless approval process.  It was a pleasure seeing you again.  Thank you for letting me participate in your care.  Tonny Bollman, MD [facility] [place]  Control of Mold Allergen   Mold and fungi can grow on a variety of surfaces provided certain temperature and moisture conditions exist.  Outdoor molds grow on plants, decaying vegetation and soil.  The major outdoor mold, Alternaria and Cladosporium, are found in very high numbers during hot and dry conditions.  Generally, a late Summer - Fall peak is seen for common outdoor fungal spores.  Rain will temporarily lower outdoor mold spore count, but counts rise rapidly when the rainy period ends.  The most important indoor molds are Aspergillus and Penicillium.  Dark, humid and poorly ventilated basements are ideal sites for mold growth.  The next most common sites of mold growth are the bathroom and the kitchen.  Outdoor (Seasonal) Mold Control  Use air conditioning and keep windows closed Avoid exposure to decaying vegetation. Avoid leaf raking. Avoid grain handling. Consider wearing a face mask if working in moldy areas.    Indoor (Perennial) Mold Control   Maintain humidity below 50%. Clean washable surfaces with 5% bleach solution. Remove sources e.g. contaminated carpets.

## 2023-04-26 ENCOUNTER — Telehealth: Payer: Self-pay | Admitting: *Deleted

## 2023-04-26 NOTE — Telephone Encounter (Signed)
-----   Message from Verlee Monte sent at 04/21/2023  8:30 AM EDT ----- Unknown Foley we see about getting him on fasenra for asthma? AEC 400. On Symbicort 160 already. Yearly multiple rounds of steroids. Thanks

## 2023-04-26 NOTE — Telephone Encounter (Signed)
 T/c to patient need his PBM info in order to get approval for [facility]

## 2023-04-28 LAB — CBC WITH DIFFERENTIAL/PLATELET
Basophils Absolute: 0.1 10*3/uL (ref 0.0–0.2)
Basos: 1 %
EOS (ABSOLUTE): 0.4 10*3/uL (ref 0.0–0.4)
Eos: 4 %
Hematocrit: 45.9 % (ref 37.5–51.0)
Hemoglobin: 14.8 g/dL (ref 13.0–17.7)
Immature Grans (Abs): 0.1 10*3/uL (ref 0.0–0.1)
Immature Granulocytes: 1 %
Lymphocytes Absolute: 3.1 10*3/uL (ref 0.7–3.1)
Lymphs: 32 %
MCH: 25.4 pg — ABNORMAL LOW (ref 26.6–33.0)
MCHC: 32.2 g/dL (ref 31.5–35.7)
MCV: 79 fL (ref 79–97)
Monocytes Absolute: 0.8 10*3/uL (ref 0.1–0.9)
Monocytes: 9 %
Neutrophils Absolute: 5.3 10*3/uL (ref 1.4–7.0)
Neutrophils: 53 %
Platelets: 329 10*3/uL (ref 150–450)
RBC: 5.83 x10E6/uL — ABNORMAL HIGH (ref 4.14–5.80)
RDW: 13.3 % (ref 11.6–15.4)
WBC: 9.7 10*3/uL (ref 3.4–10.8)

## 2023-04-28 LAB — IGE: IgE (Immunoglobulin E), Serum: 14 [IU]/mL (ref 6–495)

## 2023-05-01 NOTE — Telephone Encounter (Signed)
 Advised patient approval, copay card and submit to [facility] fo Fasenra. He wishes to get injs in clinic so I will reach out once delivery set to make appt to start therapy

## 2023-05-17 DIAGNOSIS — F411 Generalized anxiety disorder: Secondary | ICD-10-CM | POA: Diagnosis not present

## 2023-05-17 DIAGNOSIS — F3341 Major depressive disorder, recurrent, in partial remission: Secondary | ICD-10-CM | POA: Diagnosis not present

## 2023-05-17 DIAGNOSIS — R053 Chronic cough: Secondary | ICD-10-CM | POA: Diagnosis not present

## 2023-05-17 DIAGNOSIS — I10 Essential (primary) hypertension: Secondary | ICD-10-CM | POA: Diagnosis not present

## 2023-05-31 DIAGNOSIS — E782 Mixed hyperlipidemia: Secondary | ICD-10-CM | POA: Diagnosis not present

## 2023-05-31 DIAGNOSIS — F3341 Major depressive disorder, recurrent, in partial remission: Secondary | ICD-10-CM | POA: Diagnosis not present

## 2023-05-31 DIAGNOSIS — E291 Testicular hypofunction: Secondary | ICD-10-CM | POA: Diagnosis not present

## 2023-05-31 DIAGNOSIS — I10 Essential (primary) hypertension: Secondary | ICD-10-CM | POA: Diagnosis not present

## 2023-06-01 DIAGNOSIS — N529 Male erectile dysfunction, unspecified: Secondary | ICD-10-CM | POA: Diagnosis not present

## 2023-06-01 DIAGNOSIS — E782 Mixed hyperlipidemia: Secondary | ICD-10-CM | POA: Diagnosis not present

## 2023-06-01 DIAGNOSIS — E611 Iron deficiency: Secondary | ICD-10-CM | POA: Diagnosis not present

## 2023-06-01 DIAGNOSIS — I10 Essential (primary) hypertension: Secondary | ICD-10-CM | POA: Diagnosis not present

## 2023-06-01 DIAGNOSIS — E559 Vitamin D deficiency, unspecified: Secondary | ICD-10-CM | POA: Diagnosis not present

## 2023-06-01 DIAGNOSIS — R5383 Other fatigue: Secondary | ICD-10-CM | POA: Diagnosis not present

## 2023-06-02 DIAGNOSIS — E291 Testicular hypofunction: Secondary | ICD-10-CM | POA: Diagnosis not present

## 2023-06-09 DIAGNOSIS — E291 Testicular hypofunction: Secondary | ICD-10-CM | POA: Diagnosis not present

## 2023-06-14 DIAGNOSIS — F332 Major depressive disorder, recurrent severe without psychotic features: Secondary | ICD-10-CM | POA: Diagnosis not present

## 2023-06-21 DIAGNOSIS — Z79899 Other long term (current) drug therapy: Secondary | ICD-10-CM | POA: Diagnosis not present

## 2023-06-21 DIAGNOSIS — F411 Generalized anxiety disorder: Secondary | ICD-10-CM | POA: Diagnosis not present

## 2023-06-21 DIAGNOSIS — F902 Attention-deficit hyperactivity disorder, combined type: Secondary | ICD-10-CM | POA: Diagnosis not present

## 2023-06-26 ENCOUNTER — Ambulatory Visit

## 2023-06-26 DIAGNOSIS — J4540 Moderate persistent asthma, uncomplicated: Secondary | ICD-10-CM

## 2023-06-26 MED ORDER — BENRALIZUMAB 30 MG/ML ~~LOC~~ SOSY
30.0000 mg | PREFILLED_SYRINGE | Freq: Once | SUBCUTANEOUS | Status: AC
  Administered 2023-06-26: 30 mg via SUBCUTANEOUS

## 2023-06-26 NOTE — Progress Notes (Signed)
 Immunotherapy   Patient Details  Name: Douglas Cervantes MRN: [id number] Date of Birth: Apr 16, 1990  06/26/2023  [name]  started biologic therapy for Fasenra Following schedule: Every 4 weeks for 1st three doses and every 8 weeks after.  Epi-Pen:Epi-Pen Available  Consent signed and patient instructions given. Spouse gave injection and patient waited in office for 15 minutes. Will administer at home.   Merwyn Achilles 06/26/2023, 3:33 PM

## 2023-06-28 DIAGNOSIS — F332 Major depressive disorder, recurrent severe without psychotic features: Secondary | ICD-10-CM | POA: Diagnosis not present

## 2023-06-29 DIAGNOSIS — F332 Major depressive disorder, recurrent severe without psychotic features: Secondary | ICD-10-CM | POA: Diagnosis not present

## 2023-06-30 DIAGNOSIS — F332 Major depressive disorder, recurrent severe without psychotic features: Secondary | ICD-10-CM | POA: Diagnosis not present

## 2023-07-03 DIAGNOSIS — F332 Major depressive disorder, recurrent severe without psychotic features: Secondary | ICD-10-CM | POA: Diagnosis not present

## 2023-07-04 DIAGNOSIS — F332 Major depressive disorder, recurrent severe without psychotic features: Secondary | ICD-10-CM | POA: Diagnosis not present

## 2023-07-05 DIAGNOSIS — F332 Major depressive disorder, recurrent severe without psychotic features: Secondary | ICD-10-CM | POA: Diagnosis not present

## 2023-07-06 DIAGNOSIS — F332 Major depressive disorder, recurrent severe without psychotic features: Secondary | ICD-10-CM | POA: Diagnosis not present

## 2023-07-07 DIAGNOSIS — F332 Major depressive disorder, recurrent severe without psychotic features: Secondary | ICD-10-CM | POA: Diagnosis not present

## 2023-07-10 DIAGNOSIS — F332 Major depressive disorder, recurrent severe without psychotic features: Secondary | ICD-10-CM | POA: Diagnosis not present

## 2023-07-11 DIAGNOSIS — F332 Major depressive disorder, recurrent severe without psychotic features: Secondary | ICD-10-CM | POA: Diagnosis not present

## 2023-07-12 DIAGNOSIS — F332 Major depressive disorder, recurrent severe without psychotic features: Secondary | ICD-10-CM | POA: Diagnosis not present

## 2023-07-13 DIAGNOSIS — F332 Major depressive disorder, recurrent severe without psychotic features: Secondary | ICD-10-CM | POA: Diagnosis not present

## 2023-07-14 DIAGNOSIS — F332 Major depressive disorder, recurrent severe without psychotic features: Secondary | ICD-10-CM | POA: Diagnosis not present

## 2023-07-17 DIAGNOSIS — F332 Major depressive disorder, recurrent severe without psychotic features: Secondary | ICD-10-CM | POA: Diagnosis not present

## 2023-07-18 DIAGNOSIS — F332 Major depressive disorder, recurrent severe without psychotic features: Secondary | ICD-10-CM | POA: Diagnosis not present

## 2023-07-19 DIAGNOSIS — F332 Major depressive disorder, recurrent severe without psychotic features: Secondary | ICD-10-CM | POA: Diagnosis not present

## 2023-07-20 DIAGNOSIS — F332 Major depressive disorder, recurrent severe without psychotic features: Secondary | ICD-10-CM | POA: Diagnosis not present

## 2023-07-21 DIAGNOSIS — F332 Major depressive disorder, recurrent severe without psychotic features: Secondary | ICD-10-CM | POA: Diagnosis not present

## 2023-07-24 DIAGNOSIS — F332 Major depressive disorder, recurrent severe without psychotic features: Secondary | ICD-10-CM | POA: Diagnosis not present

## 2023-07-25 DIAGNOSIS — F332 Major depressive disorder, recurrent severe without psychotic features: Secondary | ICD-10-CM | POA: Diagnosis not present

## 2023-07-26 DIAGNOSIS — F332 Major depressive disorder, recurrent severe without psychotic features: Secondary | ICD-10-CM | POA: Diagnosis not present

## 2023-07-31 DIAGNOSIS — F332 Major depressive disorder, recurrent severe without psychotic features: Secondary | ICD-10-CM | POA: Diagnosis not present

## 2023-08-01 DIAGNOSIS — F332 Major depressive disorder, recurrent severe without psychotic features: Secondary | ICD-10-CM | POA: Diagnosis not present

## 2023-08-02 DIAGNOSIS — F332 Major depressive disorder, recurrent severe without psychotic features: Secondary | ICD-10-CM | POA: Diagnosis not present

## 2023-08-03 DIAGNOSIS — F332 Major depressive disorder, recurrent severe without psychotic features: Secondary | ICD-10-CM | POA: Diagnosis not present

## 2023-08-04 DIAGNOSIS — F332 Major depressive disorder, recurrent severe without psychotic features: Secondary | ICD-10-CM | POA: Diagnosis not present

## 2023-08-07 DIAGNOSIS — F332 Major depressive disorder, recurrent severe without psychotic features: Secondary | ICD-10-CM | POA: Diagnosis not present

## 2023-08-08 DIAGNOSIS — F332 Major depressive disorder, recurrent severe without psychotic features: Secondary | ICD-10-CM | POA: Diagnosis not present

## 2023-08-09 DIAGNOSIS — F332 Major depressive disorder, recurrent severe without psychotic features: Secondary | ICD-10-CM | POA: Diagnosis not present

## 2023-08-10 DIAGNOSIS — F332 Major depressive disorder, recurrent severe without psychotic features: Secondary | ICD-10-CM | POA: Diagnosis not present

## 2023-08-14 DIAGNOSIS — F332 Major depressive disorder, recurrent severe without psychotic features: Secondary | ICD-10-CM | POA: Diagnosis not present

## 2023-08-16 DIAGNOSIS — F332 Major depressive disorder, recurrent severe without psychotic features: Secondary | ICD-10-CM | POA: Diagnosis not present

## 2023-08-18 DIAGNOSIS — F332 Major depressive disorder, recurrent severe without psychotic features: Secondary | ICD-10-CM | POA: Diagnosis not present

## 2023-08-21 DIAGNOSIS — F332 Major depressive disorder, recurrent severe without psychotic features: Secondary | ICD-10-CM | POA: Diagnosis not present

## 2023-08-25 DIAGNOSIS — F332 Major depressive disorder, recurrent severe without psychotic features: Secondary | ICD-10-CM | POA: Diagnosis not present

## 2023-08-28 DIAGNOSIS — F332 Major depressive disorder, recurrent severe without psychotic features: Secondary | ICD-10-CM | POA: Diagnosis not present

## 2023-08-30 DIAGNOSIS — F411 Generalized anxiety disorder: Secondary | ICD-10-CM | POA: Diagnosis not present

## 2023-08-30 DIAGNOSIS — F902 Attention-deficit hyperactivity disorder, combined type: Secondary | ICD-10-CM | POA: Diagnosis not present

## 2023-09-08 DIAGNOSIS — F902 Attention-deficit hyperactivity disorder, combined type: Secondary | ICD-10-CM | POA: Diagnosis not present

## 2023-09-08 DIAGNOSIS — F411 Generalized anxiety disorder: Secondary | ICD-10-CM | POA: Diagnosis not present

## 2023-09-13 DIAGNOSIS — F411 Generalized anxiety disorder: Secondary | ICD-10-CM | POA: Diagnosis not present

## 2023-09-13 DIAGNOSIS — F902 Attention-deficit hyperactivity disorder, combined type: Secondary | ICD-10-CM | POA: Diagnosis not present

## 2023-09-15 DIAGNOSIS — Z79899 Other long term (current) drug therapy: Secondary | ICD-10-CM | POA: Diagnosis not present

## 2023-09-15 DIAGNOSIS — F411 Generalized anxiety disorder: Secondary | ICD-10-CM | POA: Diagnosis not present

## 2023-09-15 DIAGNOSIS — F902 Attention-deficit hyperactivity disorder, combined type: Secondary | ICD-10-CM | POA: Diagnosis not present

## 2023-09-22 DIAGNOSIS — I10 Essential (primary) hypertension: Secondary | ICD-10-CM | POA: Diagnosis not present

## 2023-09-22 DIAGNOSIS — E611 Iron deficiency: Secondary | ICD-10-CM | POA: Diagnosis not present

## 2023-09-22 DIAGNOSIS — F411 Generalized anxiety disorder: Secondary | ICD-10-CM | POA: Diagnosis not present

## 2023-09-22 DIAGNOSIS — E291 Testicular hypofunction: Secondary | ICD-10-CM | POA: Diagnosis not present

## 2023-09-22 DIAGNOSIS — Z6830 Body mass index (BMI) 30.0-30.9, adult: Secondary | ICD-10-CM | POA: Diagnosis not present

## 2023-09-22 DIAGNOSIS — F902 Attention-deficit hyperactivity disorder, combined type: Secondary | ICD-10-CM | POA: Diagnosis not present

## 2023-09-27 DIAGNOSIS — F902 Attention-deficit hyperactivity disorder, combined type: Secondary | ICD-10-CM | POA: Diagnosis not present

## 2023-09-27 DIAGNOSIS — F411 Generalized anxiety disorder: Secondary | ICD-10-CM | POA: Diagnosis not present

## 2023-10-04 DIAGNOSIS — F332 Major depressive disorder, recurrent severe without psychotic features: Secondary | ICD-10-CM | POA: Diagnosis not present

## 2023-10-04 DIAGNOSIS — F411 Generalized anxiety disorder: Secondary | ICD-10-CM | POA: Diagnosis not present

## 2023-10-04 DIAGNOSIS — F902 Attention-deficit hyperactivity disorder, combined type: Secondary | ICD-10-CM | POA: Diagnosis not present

## 2023-10-13 DIAGNOSIS — F902 Attention-deficit hyperactivity disorder, combined type: Secondary | ICD-10-CM | POA: Diagnosis not present

## 2023-10-13 DIAGNOSIS — F411 Generalized anxiety disorder: Secondary | ICD-10-CM | POA: Diagnosis not present

## 2023-10-20 DIAGNOSIS — F902 Attention-deficit hyperactivity disorder, combined type: Secondary | ICD-10-CM | POA: Diagnosis not present

## 2023-10-20 DIAGNOSIS — F411 Generalized anxiety disorder: Secondary | ICD-10-CM | POA: Diagnosis not present

## 2023-10-25 DIAGNOSIS — F411 Generalized anxiety disorder: Secondary | ICD-10-CM | POA: Diagnosis not present

## 2023-10-25 DIAGNOSIS — F902 Attention-deficit hyperactivity disorder, combined type: Secondary | ICD-10-CM | POA: Diagnosis not present

## 2023-11-02 ENCOUNTER — Other Ambulatory Visit: Payer: Self-pay

## 2023-11-02 ENCOUNTER — Encounter: Payer: Self-pay | Admitting: Internal Medicine

## 2023-11-02 ENCOUNTER — Ambulatory Visit: Admitting: Internal Medicine

## 2023-11-02 VITALS — BP 118/74 | HR 100 | Temp 98.0°F | Resp 18 | Ht 70.0 in | Wt 223.3 lb

## 2023-11-02 DIAGNOSIS — J4540 Moderate persistent asthma, uncomplicated: Secondary | ICD-10-CM

## 2023-11-02 DIAGNOSIS — J3089 Other allergic rhinitis: Secondary | ICD-10-CM | POA: Diagnosis not present

## 2023-11-02 DIAGNOSIS — R053 Chronic cough: Secondary | ICD-10-CM | POA: Diagnosis not present

## 2023-11-02 DIAGNOSIS — K219 Gastro-esophageal reflux disease without esophagitis: Secondary | ICD-10-CM

## 2023-11-02 NOTE — Progress Notes (Signed)
 FOLLOW UP Date of Service/Encounter:   11/02/2023  Subjective:  Douglas Cervantes (DOB: [birth year]) is a 33 y.o. male who [facility] on 11[facility]/2023 in re-evaluation of the following: Moderate persistent asthma, chronic cough, chronic rhinitis History obtained from: chart review and patient.  For Review, LV was on 04/20/23  with Dr.Silvester Reierson seen for routine follow-up. See below for summary of history and diagnostics.   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  A repeat AEC was obtained and 400.  Fasenra  started in June of this year. ----------------------------------------------------- Pertinent History/Diagnostics:  2020 Epicutaneous testing: Negative despite a positive histamine control. 2020 Intradermal testing: Positive to molds. (Mold mix 1, 2, and 3) Sinus CT 08/23/21: Bilateral inferior maxillary sinus mucosal thickening with frothy secretions in the left maxillary sinus. Otherwise, clear sinuses  2023 sinus surgery: bilateral maxillary antrostomy with fusion 12/02/2021. Rhinoscopy showed granulated area and left ethmoid sinus.  Chronic cough: Previously prescribed gabapentin for neurogenic cough, weaned off in 2022 and symbicort  Cough returned in April 2023 following URI.  Saw ENT in following abnormal CT sinus underwent sinus surgery.  Had some relief, however cough and drainage returned.  Associate some shortness of breath and wheezing.  Following with ENT monthly.  Albuterol  does seem to be helping cough. Takes famotidine  daily for reflux and states controlled. Restarted gabapentin by ENT.  Currently on 400 mg TID of gabapentin which controls cough. Has had possible mold exposure. 02/18/22: SPT environmental allergens  negative on skin testing, IDs borderline to mold mix 2 and 4 (indoor molds)  Recurrent sinus infections:  Received Pneumovax 23 in 2020 at primary Immune labs  01/1922: normal quants, adequate diphteria/strep, inadequate strep titers (6/23) Fasenra  started 0602/25. --------------------------------------------------- Today presents for follow-up. Discussed the use of AI scribe software for clinical note transcription with the patient, who gave verbal consent to proceed.  History of Present Illness Douglas Cervantes is a 33 year old male who presents for follow-up on his current allergy  treatment regimen.  Allergic rhinitis and asthma symptoms - On injectable allergy  treatment regimen (Fasenra ) with significant improvement in symptoms, with last injection administered this morning - Recent change in seasons associated with increased congestion and difficulty breathing - Resumed use of nasal sprays due to worsening symptoms - Used Symbicort  recently but dislikes it due to side effects, including heart racing -Have been able to stop nasal sprays as well as daily inhaled controlled inhaler over the summer due to coughing control.  Has restarted these medications since started fall and inconsistent weather changes  Sleep disturbance and adhd medication - Changed ADHD medication from Vyvanse to another agent due to insomnia and anxiety - Previous stimulant medication caused difficulty falling asleep, even when taken early in the day - History of insomnia, exacerbated by stimulant medications  Testosterone  deficiency and supplementation - Diagnosed with low testosterone  - Currently taking Jatenzo, an oral testosterone  therapy - Also taking vitamin D3 and iron supplements for previous deficiencies - Improved overall well-being, though not fully recovered  Medication intolerance - Discontinued gabapentin and amitriptyline due to feeling unwell and cough being controlled without these medications  Otolaryngology follow-up - No recent follow-up with ENT - Scheduled to see ENT in November   All medications reviewed by clinical staff and updated in chart. No  new pertinent medical or surgical history except as noted in HPI.  ROS: All others negative except as noted per HPI.   Objective:  BP 118/74 (BP Location: Right Arm, Patient Position: Sitting, Cuff Size: Normal)   Pulse 100   Temp 98 F (36.7 C) (Temporal)   Resp 18   Ht 5' 10 (1.778 m)   Wt 223 lb 4.8 oz (101.3 kg)   SpO2 98%   BMI 32.04 kg/m  Body mass index is 32.04 kg/m. Physical Exam: General Appearance:  Alert, cooperative, no distress, appears stated age  Head:  Normocephalic, without obvious abnormality, atraumatic  Eyes:  Conjunctiva clear, EOM's intact  Ears EACs normal bilaterally and normal TMs bilaterally  Nose: Nares normal, hypertrophic turbinates, normal mucosa, and no visible anterior polyps  Throat: Lips, tongue normal; teeth and gums normal, normal posterior oropharynx  Neck: Supple, symmetrical  Lungs:   clear to auscultation bilaterally, Respirations unlabored, no coughing  Heart:  regular rate and rhythm and no murmur, Appears well perfused  Extremities: No edema  Skin: Skin color, texture, turgor normal and no rashes or lesions on visualized portions of skin  Neurologic: No gross deficits   Labs:  Lab Orders  No laboratory test(s) ordered today    Spirometry:  Tracings reviewed. His effort: Good reproducible efforts. FVC: 5.13L FEV1: 4.24L, 96% predicted FEV1/FVC ratio: 0.83 Interpretation: Spirometry consistent with normal pattern.  Please see scanned spirometry results for details.   Assessment/Plan   Cough, persistent-currently controlled on Fasenra  injections Continue Fasenra  every 8 weeks per protocol; home injections. - continue Symbicort  160 mcg, 2 puffs twice daily with spacer Rinse mouth out after use.  - Continue omeprazole  40 mg TWICE daily, take 30 minutes prior to meals - immune screen did show inadequate strep titers, otherwise normal (last Pneumovax-23 vaccine given 2020) - Nasal saline rinses use twice daily prior to  medicated nasal sprays - as needed: Atrovent  nasal spray 1-2 sprays up to twice daily as needed to help with drainage. Dymista  1 spay twice daily each nostril.  - allergy  testing was negative on skin testing, IDs borderline to mold mix 2 and 4 (indoor molds); allergen avoidance as below-would consider allergy  injections as a last resort -continue regular follow-up with ENT  Moderate persistent Asthma/eosinophilic-controlled on Fasenra  Daily controller inhaler: Symbicort  160 mcg 2 puffs BID. Rinse mouth after use. - Rescue Inhaler: Levalbuterol  (Xopenex ) 2 puffs . Use  every 4-6 hours as needed for chest tightness, wheezing, or coughing.  Can also use 15 minutes prior to exercise if you have symptoms with activity. - Asthma is not controlled if:  - Symptoms are occurring >2 times a week OR  - >2 times a month nighttime awakenings  - You are requiring systemic steroids (prednisone /steroid injections) more than once per year  - Your require hospitalization for your asthma.  - Please call the clinic to schedule a follow up if these symptoms arise AEC 02/11/22-200; 400 (2025)  Perennial allergic rhinitis with predominantly nonallergic component-currently controlled Nasal saline use twice daily prior to nasacort Dymista  1 spays each nostril twice daily (this will replace nasacort) Continue regimen as per Dr. Carlie  Return in 6 months, sooner if needed.   It was a pleasure seeing you again.  Thank you for letting me participate in your care.  Rocky Endow, MD [facility] [place]  Other: None  Rocky Endow, MD  [facility]  

## 2023-11-02 NOTE — Patient Instructions (Addendum)
 Cough, persistent-  Continue Fasenra  every 8 weeks per protocol; home injections. - continue Symbicort  160 mcg, 2 puffs twice daily with spacer Rinse mouth out after use.  - Continue omeprazole  40 mg TWICE daily, take 30 minutes prior to meals - immune screen did show inadequate strep titers, otherwise normal (last Pneumovax-23 vaccine given 2020) - Nasal saline rinses use twice daily prior to medicated nasal sprays - as needed: Atrovent  nasal spray 1-2 sprays up to twice daily as needed to help with drainage. Dymista  1 spay twice daily each nostril.  - allergy  testing was negative on skin testing, IDs borderline to mold mix 2 and 4 (indoor molds); allergen avoidance as below-would consider allergy  injections as a last resort -continue regular follow-up with ENT  Moderate persistent Asthma/eosinophilic Daily controller inhaler: Symbicort  160 mcg 2 puffs BID. Rinse mouth after use. - Rescue Inhaler: Levalbuterol  (Xopenex ) 2 puffs . Use  every 4-6 hours as needed for chest tightness, wheezing, or coughing.  Can also use 15 minutes prior to exercise if you have symptoms with activity. - Asthma is not controlled if:  - Symptoms are occurring >2 times a week OR  - >2 times a month nighttime awakenings  - You are requiring systemic steroids (prednisone /steroid injections) more than once per year  - Your require hospitalization for your asthma.  - Please call the clinic to schedule a follow up if these symptoms arise AEC 02/11/22-200; 400 (2025)  Perennial allergic rhinitis with predominantly nonallergic component Nasal saline use twice daily prior to nasacort Dymista  1 spays each nostril twice daily (this will replace nasacort) Continue regimen as per Dr. Carlie  Return in 6 months, sooner if needed.   It was a pleasure seeing you again.  Thank you for letting me participate in your care.  Rocky Endow, MD [facility]  Control of Mold Allergen   Mold and fungi can grow  on a variety of surfaces provided certain temperature and moisture conditions exist.  Outdoor molds grow on plants, decaying vegetation and soil.  The major outdoor mold, Alternaria and Cladosporium, are found in very high numbers during hot and dry conditions.  Generally, a late Summer - Fall peak is seen for common outdoor fungal spores.  Rain will temporarily lower outdoor mold spore count, but counts rise rapidly when the rainy period ends.  The most important indoor molds are Aspergillus and Penicillium.  Dark, humid and poorly ventilated basements are ideal sites for mold growth.  The next most common sites of mold growth are the bathroom and the kitchen.  Outdoor (Seasonal) Mold Control  Use air conditioning and keep windows closed Avoid exposure to decaying vegetation. Avoid leaf raking. Avoid grain handling. Consider wearing a face mask if working in moldy areas.    Indoor (Perennial) Mold Control   Maintain humidity below 50%. Clean washable surfaces with 5% bleach solution. Remove sources e.g. contaminated carpets.

## 2023-11-03 DIAGNOSIS — F902 Attention-deficit hyperactivity disorder, combined type: Secondary | ICD-10-CM | POA: Diagnosis not present

## 2023-11-03 DIAGNOSIS — F411 Generalized anxiety disorder: Secondary | ICD-10-CM | POA: Diagnosis not present

## 2023-11-08 DIAGNOSIS — F902 Attention-deficit hyperactivity disorder, combined type: Secondary | ICD-10-CM | POA: Diagnosis not present

## 2023-11-08 DIAGNOSIS — F411 Generalized anxiety disorder: Secondary | ICD-10-CM | POA: Diagnosis not present

## 2023-11-22 DIAGNOSIS — F411 Generalized anxiety disorder: Secondary | ICD-10-CM | POA: Diagnosis not present

## 2023-11-22 DIAGNOSIS — F902 Attention-deficit hyperactivity disorder, combined type: Secondary | ICD-10-CM | POA: Diagnosis not present

## 2023-11-29 DIAGNOSIS — F902 Attention-deficit hyperactivity disorder, combined type: Secondary | ICD-10-CM | POA: Diagnosis not present

## 2023-11-29 DIAGNOSIS — F411 Generalized anxiety disorder: Secondary | ICD-10-CM | POA: Diagnosis not present

## 2023-12-08 DIAGNOSIS — F411 Generalized anxiety disorder: Secondary | ICD-10-CM | POA: Diagnosis not present

## 2023-12-08 DIAGNOSIS — F902 Attention-deficit hyperactivity disorder, combined type: Secondary | ICD-10-CM | POA: Diagnosis not present

## 2023-12-12 DIAGNOSIS — R7309 Other abnormal glucose: Secondary | ICD-10-CM | POA: Diagnosis not present

## 2023-12-12 DIAGNOSIS — Z23 Encounter for immunization: Secondary | ICD-10-CM | POA: Diagnosis not present

## 2023-12-12 DIAGNOSIS — Z0000 Encounter for general adult medical examination without abnormal findings: Secondary | ICD-10-CM | POA: Diagnosis not present

## 2023-12-12 DIAGNOSIS — N529 Male erectile dysfunction, unspecified: Secondary | ICD-10-CM | POA: Diagnosis not present

## 2023-12-12 DIAGNOSIS — I10 Essential (primary) hypertension: Secondary | ICD-10-CM | POA: Diagnosis not present

## 2023-12-12 DIAGNOSIS — Z6830 Body mass index (BMI) 30.0-30.9, adult: Secondary | ICD-10-CM | POA: Diagnosis not present

## 2023-12-12 DIAGNOSIS — E611 Iron deficiency: Secondary | ICD-10-CM | POA: Diagnosis not present

## 2023-12-12 DIAGNOSIS — E559 Vitamin D deficiency, unspecified: Secondary | ICD-10-CM | POA: Diagnosis not present

## 2023-12-12 DIAGNOSIS — E782 Mixed hyperlipidemia: Secondary | ICD-10-CM | POA: Diagnosis not present

## 2023-12-12 DIAGNOSIS — E291 Testicular hypofunction: Secondary | ICD-10-CM | POA: Diagnosis not present

## 2023-12-12 DIAGNOSIS — J4540 Moderate persistent asthma, uncomplicated: Secondary | ICD-10-CM | POA: Diagnosis not present

## 2023-12-13 DIAGNOSIS — F411 Generalized anxiety disorder: Secondary | ICD-10-CM | POA: Diagnosis not present

## 2023-12-13 DIAGNOSIS — F902 Attention-deficit hyperactivity disorder, combined type: Secondary | ICD-10-CM | POA: Diagnosis not present

## 2023-12-25 DIAGNOSIS — F411 Generalized anxiety disorder: Secondary | ICD-10-CM | POA: Diagnosis not present

## 2023-12-25 DIAGNOSIS — F902 Attention-deficit hyperactivity disorder, combined type: Secondary | ICD-10-CM | POA: Diagnosis not present

## 2024-01-05 DIAGNOSIS — F902 Attention-deficit hyperactivity disorder, combined type: Secondary | ICD-10-CM | POA: Diagnosis not present

## 2024-01-05 DIAGNOSIS — F411 Generalized anxiety disorder: Secondary | ICD-10-CM | POA: Diagnosis not present

## 2024-01-12 DIAGNOSIS — F902 Attention-deficit hyperactivity disorder, combined type: Secondary | ICD-10-CM | POA: Diagnosis not present

## 2024-01-12 DIAGNOSIS — F411 Generalized anxiety disorder: Secondary | ICD-10-CM | POA: Diagnosis not present

## 2024-01-24 ENCOUNTER — Other Ambulatory Visit: Payer: Self-pay | Admitting: Internal Medicine

## 2024-01-24 ENCOUNTER — Other Ambulatory Visit: Payer: Self-pay | Admitting: *Deleted

## 2024-01-24 DIAGNOSIS — J4540 Moderate persistent asthma, uncomplicated: Secondary | ICD-10-CM

## 2024-01-24 MED ORDER — LEVALBUTEROL TARTRATE 45 MCG/ACT IN AERO: 2.0000 | INHALATION_SPRAY | RESPIRATORY_TRACT | 2 refills | Status: DC | PRN

## 2024-01-24 NOTE — Telephone Encounter (Signed)
 PA please. ty

## 2024-01-31 ENCOUNTER — Telehealth: Payer: Self-pay

## 2024-01-31 ENCOUNTER — Other Ambulatory Visit (HOSPITAL_COMMUNITY): Payer: Self-pay

## 2024-01-31 NOTE — Telephone Encounter (Signed)
*  AA  Pharmacy Patient Advocate Encounter   Received notification from RX Request Messages that prior authorization for Levalbuterol  HFA  is required/requested.   Insurance verification completed.   The patient is insured through [employer].   Per test claim: PA required; PA submitted to above mentioned insurance via Latent Key/confirmation #/EOC [id number] Status is pending

## 2024-01-31 NOTE — Telephone Encounter (Signed)
 Your request has been approved CaseId:[id number];Status:Approved;Review Type:Prior Auth;Coverage Start Date:01/01/2024;Coverage End Date:01/30/2025; Authorization Expiration01/07/2025

## 2024-02-01 ENCOUNTER — Other Ambulatory Visit: Payer: Self-pay

## 2024-02-01 ENCOUNTER — Encounter: Payer: Self-pay | Admitting: Family

## 2024-02-01 ENCOUNTER — Ambulatory Visit: Admitting: Family

## 2024-02-01 ENCOUNTER — Telehealth: Payer: Self-pay

## 2024-02-01 VITALS — BP 114/68 | HR 115 | Temp 98.1°F | Resp 18 | Wt 229.2 lb

## 2024-02-01 DIAGNOSIS — J4540 Moderate persistent asthma, uncomplicated: Secondary | ICD-10-CM | POA: Diagnosis not present

## 2024-02-01 DIAGNOSIS — R053 Chronic cough: Secondary | ICD-10-CM | POA: Diagnosis not present

## 2024-02-01 DIAGNOSIS — K219 Gastro-esophageal reflux disease without esophagitis: Secondary | ICD-10-CM

## 2024-02-01 DIAGNOSIS — J3089 Other allergic rhinitis: Secondary | ICD-10-CM

## 2024-02-01 DIAGNOSIS — R042 Hemoptysis: Secondary | ICD-10-CM

## 2024-02-01 MED ORDER — PREDNISONE 10 MG PO TABS: ORAL_TABLET | ORAL | 0 refills | Status: AC

## 2024-02-01 MED ORDER — AZELASTINE HCL 0.1 % NA SOLN: NASAL | 5 refills | Status: AC

## 2024-02-01 MED ORDER — LEVALBUTEROL TARTRATE 45 MCG/ACT IN AERO: 2.0000 | INHALATION_SPRAY | RESPIRATORY_TRACT | 1 refills | Status: AC | PRN

## 2024-02-01 MED ORDER — FLUTICASONE PROPIONATE 50 MCG/ACT NA SUSP: NASAL | 5 refills | Status: AC

## 2024-02-01 NOTE — Telephone Encounter (Signed)
 Patient called office and states he has a cough from previous respiratory illness the 2nd week of December and it took two weeks to get over the illness but still has a lingering cough that seems to be getting worse with symptoms of shortness of breath and increase use of rescue inhaler.

## 2024-02-01 NOTE — Patient Instructions (Addendum)
 Cough, persistent-  Continue Fasenra  every 8 weeks per protocol; home injections. - continue Symbicort  160 mcg, 2 puffs twice daily with spacer Rinse mouth out after use.  - Continue omeprazole  40 mg TWICE daily, take 30 minutes prior to meals - immune screen did show inadequate strep titers, otherwise normal (last Pneumovax-23 vaccine given 2020) - Nasal saline rinses use twice daily prior to medicated nasal sprays -Restart fluticasone  nasal spray using 1 to 2 sprays in each nostril once a day as needed for stuffy nose -Restart azelastine  nasal spray using 1 to 2 sprays in each nostril once to twice a day as needed for runny nose drainage down the throat - allergy  testing was negative on skin testing, IDs borderline to mold mix 2 and 4 (indoor molds); allergen avoidance as below-would consider allergy  injections as a last resort -continue regular follow-up with ENT - Discussed if cough still persist after treatment recommend calling ear nose and throat and starting back on gabapentin for cough -Start prednisone  10 mg taking 2 tablets twice a day for 3 days, then on the fourth day take 2 tablets in the morning, and on the fifth day take 1 tablet and stop - Will order a stat chest x-ray due to hemoptysis.  We will call you with results once they are back  Moderate persistent Asthma/eosinophilic Daily controller inhaler: Symbicort  160 mcg 2 puffs BID. Rinse mouth after use. - Rescue Inhaler: Levalbuterol  (Xopenex ) 2 puffs . Use  every 4-6 hours as needed for chest tightness, wheezing, or coughing.  Can also use 15 minutes prior to exercise if you have symptoms with activity. - Asthma is not controlled if:  - Symptoms are occurring >2 times a week OR  - >2 times a month nighttime awakenings  - You are requiring systemic steroids (prednisone /steroid injections) more than once per year  - Your require hospitalization for your asthma.  - Please call the clinic to schedule a follow up if these  symptoms arise AEC 02/11/22-200; 400 (2025)  Perennial allergic rhinitis with predominantly nonallergic component Nasal saline use twice daily prior to nasacort Dymista  1 spays each nostril twice daily (this will replace nasacort) Continue regimen as per Dr. Carlie  Keep already scheduled follow-up appointment on May 06, 2024 at 10:15 AM with Dr. Marinda, sooner if needed.     Control of Mold Allergen   Mold and fungi can grow on a variety of surfaces provided certain temperature and moisture conditions exist.  Outdoor molds grow on plants, decaying vegetation and soil.  The major outdoor mold, Alternaria and Cladosporium, are found in very high numbers during hot and dry conditions.  Generally, a late Summer - Fall peak is seen for common outdoor fungal spores.  Rain will temporarily lower outdoor mold spore count, but counts rise rapidly when the rainy period ends.  The most important indoor molds are Aspergillus and Penicillium.  Dark, humid and poorly ventilated basements are ideal sites for mold growth.  The next most common sites of mold growth are the bathroom and the kitchen.  Outdoor (Seasonal) Mold Control  Use air conditioning and keep windows closed Avoid exposure to decaying vegetation. Avoid leaf raking. Avoid grain handling. Consider wearing a face mask if working in moldy areas.    Indoor (Perennial) Mold Control   Maintain humidity below 50%. Clean washable surfaces with 5% bleach solution. Remove sources e.g. contaminated carpets.

## 2024-02-01 NOTE — Progress Notes (Signed)
 "  [address] [place] [address] Dept: [phone]  FOLLOW UP NOTE  Patient ID: Douglas Cervantes, male    DOB: 1990/07/21  Age: 34 y.o. MRN: [id number] Date of Office Visit: 02/01/2024  Assessment  Chief Complaint: Cough (Same day cough x 1 week declined spiro feels like he will pass out)  HPI Douglas Cervantes is a 34 year old male who presents today for an acute visit of cough.  He was last seen on November 02, 2023 by Dr. Marinda for persistent cough, moderate persistent asthma/eosinophilic, and perennial allergic rhinitis with predominantly nonallergic component.  Pertinent History/Diagnostics:  2020 Epicutaneous testing: Negative despite a positive histamine control. 2020 Intradermal testing: Positive to molds. (Mold mix 1, 2, and 3) Sinus CT 08/23/21: Bilateral inferior maxillary sinus mucosal thickening with frothy secretions in the left maxillary sinus. Otherwise, clear sinuses  2023 sinus surgery: bilateral maxillary antrostomy with fusion 12/02/2021. Rhinoscopy showed granulated area and left ethmoid sinus.  Chronic cough: Previously prescribed gabapentin for neurogenic cough, weaned off in 2022 and symbicort  Cough returned in April 2023 following URI.  Saw ENT in following abnormal CT sinus underwent sinus surgery.  Had some relief, however cough and drainage returned.  Associate some shortness of breath and wheezing.  Following with ENT monthly.  Albuterol  does seem to be helping cough. Takes famotidine  daily for reflux and states controlled. Restarted gabapentin by ENT.  Currently on 400 mg TID of gabapentin which controls cough. Has had possible mold exposure. 02/18/22: SPT environmental allergens  negative on skin testing, IDs borderline to mold mix 2 and 4 (indoor molds)  Recurrent sinus infections:  Received Pneumovax 23 in 2020 at [facility] 01/1922: normal quants, adequate diphteria/strep, inadequate strep titers (6/23) Fasenra  started 0602/25.  He reports that he  got the crud the beginning of December.  He had not a large cough and was feeling better, but still had a lingering cough.  However the cough is now getting worse.  Today there has been 2 times where he has coughed up a little bit of blood.  He is not certain if the blood is coming from him coughing so much.  Prior to that his sputum was clear.  He reports postnasal drip, a little tickle in his throat, and a stuffy nose that when he blows his nose will  be clear in color.  He has not checked for fever, but does not feel like he has fevers.  He has bodyaches only when he coughs a lot his chest will hurt.  He feels exhausted.  He has been taking Symbicort  160/4.5 mcg 2 puffs twice a day, sinus rinse twice a day, and omeprazole  40 mg twice a day.  He reports his reflux is bad if he is not on this.  He has been out of fluticasone  nasal spray and azelastine  nasal spray for a while. He does not think he has ipratropium nasal spray. He continues to receive Fasenra  injections per protocol.  He has also had some wheezing and will feel tightness in his chest with coughing fits.  He will feel short of breath with coughing fits and sometimes he will feel like he is not getting enough air in.  At that point in time he would use his Xopenex  and he felt like he could breathe better.  He he does not have any Xopenex  now and reports that it requires a preauthorization.  Albuterol  causes his heart to race fast.  Since his last office visit he has  not required any systemic steroids or made any trips to the emergency room or urgent care due to breathing problems.  His cough is waking him up at night.  He does mention that his lungs feel clear.  He reports that he has had this cough, that occurs off-and-on, since 2017 and what works for 1 round of coughing does not always help another.  He has been on prednisone  in the past for almost a year to where he gained 50 pounds and has not been able to lose it.  He has not restarted  gabapentin for the cough yet, but because it makes him feel not smart while he is taking it.  Drug Allergies:  Allergies[1]  Review of Systems: Negative except as per HPI  Physical Exam: BP 114/68   Pulse (!) 115   Temp 98.1 F (36.7 C) (Temporal)   Resp 18   Wt 229 lb 3.2 oz (104 kg)   BMI 32.89 kg/m    Physical Exam Constitutional:      Appearance: Normal appearance.  HENT:     Head: Normocephalic and atraumatic.     Comments: Pharynx normal, eyes normal, ears normal, nose normal    Right Ear: Tympanic membrane, ear canal and external ear normal.     Left Ear: Tympanic membrane, ear canal and external ear normal.     Nose: Nose normal.     Mouth/Throat:     Mouth: Mucous membranes are moist.     Pharynx: Oropharynx is clear.  Eyes:     Conjunctiva/sclera: Conjunctivae normal.  Cardiovascular:     Rate and Rhythm: Regular rhythm.     Heart sounds: Normal heart sounds.  Pulmonary:     Effort: Pulmonary effort is normal.     Breath sounds: Normal breath sounds.     Comments: Lungs clear to auscultation Musculoskeletal:     Cervical back: Neck supple.  Skin:    General: Skin is warm.  Neurological:     Mental Status: He is alert and oriented to person, place, and time.  Psychiatric:        Mood and Affect: Mood normal.        Behavior: Behavior normal.        Thought Content: Thought content normal.        Judgment: Judgment normal.     Diagnostics: Spirometry not completed today due to patient's request  Assessment and Plan: 1. Chronic cough   2. Cough with hemoptysis   3. Moderate persistent asthma without complication   4. Perennial allergic rhinitis with predominantly nonallergic component   5. Gastroesophageal reflux disease, unspecified whether esophagitis present     Meds ordered this encounter  Medications   levalbuterol  (XOPENEX  HFA) 45 MCG/ACT inhaler    Sig: Inhale 2 puffs into the lungs every 4 (four) hours as needed for wheezing.     Dispense:  1 each    Refill:  1   fluticasone  (FLONASE ) 50 MCG/ACT nasal spray    Sig: Place 1 to 2 sprays in each nostril once a day as needed for stuffy nose    Dispense:  16 g    Refill:  5   predniSONE  (DELTASONE ) 10 MG tablet    Sig: 2 tablets twice a day for 3 days, then on the fourth day take 2 tablets in the morning, and on the fifth day take 1 tablet and stop    Dispense:  15 tablet    Refill:  0   azelastine  (  ASTELIN ) 0.1 % nasal spray    Sig: Placed 1 to 2 sprays in each nostril once or twice a day as needed for runny nose/drainage down throat    Dispense:  30 mL    Refill:  5    Patient Instructions  Cough, persistent-  Continue Fasenra  every 8 weeks per protocol; home injections. - continue Symbicort  160 mcg, 2 puffs twice daily with spacer Rinse mouth out after use.  - Continue omeprazole  40 mg TWICE daily, take 30 minutes prior to meals - immune screen did show inadequate strep titers, otherwise normal (last Pneumovax-23 vaccine given 2020) - Nasal saline rinses use twice daily prior to medicated nasal sprays -Restart fluticasone  nasal spray using 1 to 2 sprays in each nostril once a day as needed for stuffy nose -Restart azelastine  nasal spray using 1 to 2 sprays in each nostril once to twice a day as needed for runny nose drainage down the throat - allergy  testing was negative on skin testing, IDs borderline to mold mix 2 and 4 (indoor molds); allergen avoidance as below-would consider allergy  injections as a last resort -continue regular follow-up with ENT - Discussed if cough still persist after treatment recommend calling ear nose and throat and starting back on gabapentin for cough -Start prednisone  10 mg taking 2 tablets twice a day for 3 days, then on the fourth day take 2 tablets in the morning, and on the fifth day take 1 tablet and stop - Will order a stat chest x-ray due to hemoptysis.  We will call you with results once they are back  Moderate persistent  Asthma/eosinophilic Daily controller inhaler: Symbicort  160 mcg 2 puffs BID. Rinse mouth after use. - Rescue Inhaler: Levalbuterol  (Xopenex ) 2 puffs . Use  every 4-6 hours as needed for chest tightness, wheezing, or coughing.  Can also use 15 minutes prior to exercise if you have symptoms with activity. - Asthma is not controlled if:  - Symptoms are occurring >2 times a week OR  - >2 times a month nighttime awakenings  - You are requiring systemic steroids (prednisone /steroid injections) more than once per year  - Your require hospitalization for your asthma.  - Please call the clinic to schedule a follow up if these symptoms arise AEC 02/11/22-200; 400 (2025)  Perennial allergic rhinitis with predominantly nonallergic component Nasal saline use twice daily prior to nasacort Dymista  1 spays each nostril twice daily (this will replace nasacort) Continue regimen as per Dr. Carlie  Keep already scheduled follow-up appointment on May 06, 2024 at 10:15 AM with Dr. Marinda, sooner if needed.     Control of Mold Allergen   Mold and fungi can grow on a variety of surfaces provided certain temperature and moisture conditions exist.  Outdoor molds grow on plants, decaying vegetation and soil.  The major outdoor mold, Alternaria and Cladosporium, are found in very high numbers during hot and dry conditions.  Generally, a late Summer - Fall peak is seen for common outdoor fungal spores.  Rain will temporarily lower outdoor mold spore count, but counts rise rapidly when the rainy period ends.  The most important indoor molds are Aspergillus and Penicillium.  Dark, humid and poorly ventilated basements are ideal sites for mold growth.  The next most common sites of mold growth are the bathroom and the kitchen.  Outdoor (Seasonal) Mold Control  Use air conditioning and keep windows closed Avoid exposure to decaying vegetation. Avoid leaf raking. Avoid grain handling. Consider wearing a face mask  if  working in moldy areas.    Indoor (Perennial) Mold Control   Maintain humidity below 50%. Clean washable surfaces with 5% bleach solution. Remove sources e.g. contaminated carpets.  Return in about 3 months (around 05/06/2024), or if symptoms worsen or fail to improve.    Thank you for the opportunity to care for this patient.  Please do not hesitate to contact me with questions.  Wanda Craze, FNP [facility] [place]         [1] No Known Allergies  "

## 2024-05-06 ENCOUNTER — Ambulatory Visit: Admitting: Internal Medicine
